# Patient Record
Sex: Female | Born: 1945 | Race: White | Hispanic: No | Smoking: Never smoker
Health system: Southern US, Community
[De-identification: ages and names within clinical notes are randomized; demographics above are authoritative.]

## PROBLEM LIST (undated history)

## (undated) DIAGNOSIS — F329 Major depressive disorder, single episode, unspecified: Secondary | ICD-10-CM

## (undated) DIAGNOSIS — I10 Essential (primary) hypertension: Secondary | ICD-10-CM

## (undated) DIAGNOSIS — E7800 Pure hypercholesterolemia, unspecified: Secondary | ICD-10-CM

## (undated) DIAGNOSIS — G2581 Restless legs syndrome: Secondary | ICD-10-CM

## (undated) DIAGNOSIS — F32A Depression, unspecified: Secondary | ICD-10-CM

## (undated) HISTORY — PX: BUNIONECTOMY: SHX129

## (undated) HISTORY — PX: CHOLECYSTECTOMY: SHX55

---

## 2012-04-22 DIAGNOSIS — Z85828 Personal history of other malignant neoplasm of skin: Secondary | ICD-10-CM | POA: Insufficient documentation

## 2012-05-05 DIAGNOSIS — G629 Polyneuropathy, unspecified: Secondary | ICD-10-CM | POA: Insufficient documentation

## 2012-11-04 DIAGNOSIS — L570 Actinic keratosis: Secondary | ICD-10-CM | POA: Insufficient documentation

## 2014-04-11 DIAGNOSIS — S32009A Unspecified fracture of unspecified lumbar vertebra, initial encounter for closed fracture: Secondary | ICD-10-CM | POA: Insufficient documentation

## 2016-03-27 ENCOUNTER — Encounter: Payer: Self-pay | Admitting: Physical Therapy

## 2016-03-27 ENCOUNTER — Ambulatory Visit: Payer: Medicare Other | Attending: Family Medicine | Admitting: Physical Therapy

## 2016-03-27 DIAGNOSIS — R269 Unspecified abnormalities of gait and mobility: Secondary | ICD-10-CM | POA: Insufficient documentation

## 2016-03-27 DIAGNOSIS — M6281 Muscle weakness (generalized): Secondary | ICD-10-CM | POA: Diagnosis not present

## 2016-03-28 NOTE — Therapy (Deleted)
[facility] [address] [place], [place], [address] Phone: [phone]   Fax:  [phone]  Physical Therapy Evaluation  Patient Details  Name: Barbara Ballard MRN: [id number] Date of Birth: Jun 16, 1946 Referring Provider: Doristine MangoElizabeth White  Encounter Date: 03/27/2016    History reviewed. No pertinent past medical history.  History reviewed. No pertinent past surgical history.  There were no vitals filed for this visit.               Patient will benefit from skilled therapeutic intervention in order to improve the following deficits and impairments:  Abnormal gait, Decreased strength, Improper body mechanics, Postural dysfunction, Difficulty walking, Decreased activity tolerance, Decreased mobility, Decreased range of motion, Decreased balance, Decreased safety awareness, Decreased coordination, Decreased endurance  Visit Diagnosis: Muscle weakness (generalized)  Gait difficulty     Problem List There are no active problems to display for this patient.  Cammie McgeeMichael C Lilliahna Schubring, PT, DPT # [id number]   04/01/2016, 12:48 PM   [facility] [address] [place], [place], [address] Phone: [phone]   Fax:  [phone]  Name: Barbara Ballard MRN: [id number] Date of Birth: Jun 16, 1946

## 2016-03-29 ENCOUNTER — Ambulatory Visit: Payer: Medicare Other | Admitting: Physical Therapy

## 2016-03-29 DIAGNOSIS — M6281 Muscle weakness (generalized): Secondary | ICD-10-CM | POA: Diagnosis not present

## 2016-03-29 DIAGNOSIS — R269 Unspecified abnormalities of gait and mobility: Secondary | ICD-10-CM

## 2016-03-30 NOTE — Therapy (Addendum)
[facility] [address] [place], [place], [address] Phone: [phone]   Fax:  [phone]  Physical Therapy Treatment  Patient Details  Name: Barbara Ballard MRN: [id number] Date of Birth: Feb 05, 1946 Referring Provider: Doristine Mango  Encounter Date: 03/29/2016    No past medical history on file.  No past surgical history on file.  There were no vitals filed for this visit.          [facility] PT Assessment - 04/01/16 0001    Assessment   Medical Diagnosis Falls frequently/ B LE muscle weakness   Referring Provider Doristine Mango   Onset Date/Surgical Date 11/27/15   Balance Screen   Has the patient fallen in the past 6 months Yes   How many times? 5+       Pt. reports no falls and states she is doing HEP on floor.  Pt. states she is still not interested in using [facility] with daily walking.       OBJECTIVE:  There.ex.:  Scifit L6 B UE/LE (no charge/ warm-up)- B thigh soreness.  Standing YTB posture ex.: scap. Retraction/ diagonal/ horizontal abduction 20x each (see handouts)- reviewed HEP.  Neuro: Obstacle course in hallway (cone taps/ step ups/ downs/ Airex).  Star ex./ Standing tandem stance and gait progression.  CGA for safety and verbal cuing.      Pt response for medical necessity:  Benefits from posture/ LE strengthening ex. To improve standing/walking balance.  CGA with higher level walking/ balance.  Generalized deconditioning.           PT Long Term Goals - 03/28/16 1243    PT LONG TERM GOAL #1   Title Pt. I with HEP to increase B LE muscle strength 1/2 muscle grade to improve standing/balance.     Baseline B LE flexibility WNL and strength grossly 4/5 MMT, except R hip flexion 4-/5, R hip IR 4+5, L knee flexion/ext 4+/5 MMT.   Time 4   Period Weeks   Status New   PT LONG TERM GOAL #2   Title Pt. will increase Berg balance test >52/56 to promote safety/ prevent falls.     Baseline Berg 48/56 on 5/2   Time 4   Status New   PT LONG TERM GOAL #3   Title Pt. able to proper lift 10# box from floor to waist and carry in clinic with no LOB to prevent falls.    Baseline several falls lifting heavier objects at home.    Time 4   Period Weeks   Status New   PT LONG TERM GOAL #4   Title Pt. will return to [employer] ex. program on a consistent schedule to improve LE muscle strength/ decrease fall risk.     Time 4   Period Weeks   Status New               Plan - 04/01/16 1306    Clinical Impression Statement PT incorporated standing postural ex. with resistance to improve center of balance/ shoulder/ head position.  Pt. benefits from CGA with all balance/ walking tasks in clinic and outside.  Pt. states she has stiffness in B LE and sorenss in thighs with use of Scifit.  No c/o pain.  Frequent cuing to increase BOS/ stridelength.     Rehab Potential Good   PT Frequency 2x / week   PT Duration 4 weeks   PT Treatment/Interventions ADLs/Self Care Home Management;Patient/family education;Neuromuscular re-education;Balance training;Therapeutic exercise;Therapeutic activities;Functional mobility training;Stair  training;Gait training;DME Instruction;Manual techniques   PT Next Visit Plan Progress HEP/ discuss return to [employer]   PT Home Exercise Plan See handouts.    Consulted and Agree with Plan of Care Patient      Patient will benefit from skilled therapeutic intervention in order to improve the following deficits and impairments:  Abnormal gait, Decreased strength, Improper body mechanics, Postural dysfunction, Difficulty walking, Decreased activity tolerance, Decreased mobility, Decreased range of motion, Decreased balance, Decreased safety awareness, Decreased coordination, Decreased endurance  Visit Diagnosis: Muscle weakness (generalized)  Gait difficulty     Problem List There are no active problems to display for this patient.  Cammie McgeeMichael C Sita Mangen, PT, DPT # [phone]    04/01/2016, 1:10 PM  [place] [facility] [address] [place], [place], [address] Phone: [phone]   Fax:  [phone]  Name: Barbara MangesKathleena Ballard MRN: [id number] Date of Birth: [birth year]

## 2016-04-01 NOTE — Addendum Note (Signed)
Addended by: Dorene GrebeSHERK, Armanii Urbanik C on: 04/01/2016 12:55 PM   Modules accepted: Orders

## 2016-04-01 NOTE — Therapy (Addendum)
[facility] [address] [place], [place], [address] Phone: [phone]   Fax:  [phone]  Physical Therapy Evaluation  Patient Details  Name: Barbara Ballard MRN: [id number] Date of Birth: 10/22/1946 Referring Provider: Doristine MangoElizabeth White  Encounter Date: 03/27/2016    History reviewed. No pertinent past medical history.  History reviewed. No pertinent past surgical history.  There were no vitals filed for this visit.           [facility] Assessment - 04/01/16 0001    Assessment   Medical Diagnosis Falls frequently/ B LE muscle weakness   Referring Provider Doristine MangoElizabeth White   Onset Date/Surgical Date 11/27/15   Balance Screen   Has the patient fallen in the past 6 months Yes   How many times? 5+      Pt. reports numerous falls over past several months.  Pt. reports chronic h/o polyneuropathy of B feet and states her last falls was while playing soccer with grandson.  Pt. reports difficulty keeping balance when picking up heavier objects (losses balance forward).  Pts. husband diagnosed with Myastina Gravis and she does all the household chores.  Pt. was participating with [employer] ex. program prior to last fall.       There.ex.:  See HEP (handouts attached).      Pt. is a pleasant 70 y/o female with h/o frequent falls/ gait difficulty over past several months.  Pt. reports no pain but presents with diminished sensation in B feet.  Pt. has forward head posture/ thoracic kyphosis in seated/ standing posture.  Berg balance test: 48/56 and pt. would benefit from use of SPC, but is not interested in using at this time.  B LE flexibility WNL and strength grossly 4/5 MMT, except R hip flexion 4-/5, R hip IR 4+5, L knee flexion/ext 4+/5 MMT.  Pt. ambulates with slow, antalgic gait pattern limited B arm swing/ step length.  Pt. will benefit from skilled PT services to increase B LE muscle strength to improve standing balance/ gait  to prevent falls.         PT Long Term Goals - 03/28/16 1243    PT LONG TERM GOAL #1   Title Pt. I with HEP to increase B LE muscle strength 1/2 muscle grade to improve standing/balance.     Baseline B LE flexibility WNL and strength grossly 4/5 MMT, except R hip flexion 4-/5, R hip IR 4+5, L knee flexion/ext 4+/5 MMT.   Time 4   Period Weeks   Status New   PT LONG TERM GOAL #2   Title Pt. will increase Berg balance test >52/56 to promote safety/ prevent falls.     Baseline Berg 48/56 on 5/2   Time 4   Status New   PT LONG TERM GOAL #3   Title Pt. able to proper lift 10# box from floor to waist and carry in clinic with no LOB to prevent falls.    Baseline several falls lifting heavier objects at home.    Time 4   Period Weeks   Status New   PT LONG TERM GOAL #4   Title Pt. will return to [employer] ex. program on a consistent schedule to improve LE muscle strength/ decrease fall risk.     Time 4   Period Weeks   Status New             Patient will benefit from skilled therapeutic intervention in order to improve the following deficits  and impairments:  Abnormal gait, Decreased strength, Improper body mechanics, Postural dysfunction, Difficulty walking, Decreased activity tolerance, Decreased mobility, Decreased range of motion, Decreased balance, Decreased safety awareness, Decreased coordination, Decreased endurance  Visit Diagnosis: Muscle weakness (generalized)  Gait difficulty     Problem List There are no active problems to display for this patient.  Cammie Mcgee, PT, DPT # [phone]   04/01/2016, 12:48 PM  [facility] [address] [place], [place], [address] Phone: [phone]   Fax:  [phone]  Name: Barbara Ballard MRN: [id number] Date of Birth: Jan 03, 1946

## 2016-04-03 ENCOUNTER — Encounter: Payer: Medicare Other | Admitting: Physical Therapy

## 2016-04-05 ENCOUNTER — Encounter: Payer: Medicare Other | Admitting: Physical Therapy

## 2016-04-10 ENCOUNTER — Ambulatory Visit: Payer: Medicare Other | Admitting: Physical Therapy

## 2016-04-10 DIAGNOSIS — M6281 Muscle weakness (generalized): Secondary | ICD-10-CM | POA: Diagnosis not present

## 2016-04-10 DIAGNOSIS — R269 Unspecified abnormalities of gait and mobility: Secondary | ICD-10-CM

## 2016-04-11 NOTE — Therapy (Signed)
[facility] [facility] [address] [place], [place], [address] Phone: [phone]   Fax:  [phone]  Physical Therapy Treatment  Patient Details  Name: Rosio Ballard MRN: [id number] Date of Birth: 05-13-1946 Referring Provider: Doristine Mango  Encounter Date: 04/10/2016    No past medical history on file.  No past surgical history on file.  There were no vitals filed for this visit.    No falls.  Pt. had a good time in [facility].  Minimal discomfort in R knee with standing squats (discussed proper technique to avoid knees over toes).     OBJECTIVE: There.ex.: Scifit L6 B UE/LE (no charge/ warm-up)- no c/o pain but fatigue noted. Discussed/reviewed HEP.  Standing hip flexion/ abd./ knee flexion/ heel raises with posture correction in //-bars and mirror feedback 20x each.  Step ups/downs 10x2 with cuing to increase head position. Neuro: Standing tandem stance and gait progression. NBOS/ SLS.  Walking in hallway with alt. UE/LE touches (SBA/CGA for safety).    Pt response for medical necessity: Benefits from posture/ LE strengthening ex. To improve standing/walking balance. Pt. Easily fatigued with standing ex. With few rest breaks.  No LOB during tx. Today but extra time with alt. UE/LE touches.         PT Long Term Goals - 03/28/16 1243    PT LONG TERM GOAL #1   Title Pt. I with HEP to increase B LE muscle strength 1/2 muscle grade to improve standing/balance.     Baseline B LE flexibility WNL and strength grossly 4/5 MMT, except R hip flexion 4-/5, R hip IR 4+5, L knee flexion/ext 4+/5 MMT.   Time 4   Period Weeks   Status New   PT LONG TERM GOAL #2   Title Pt. will increase Berg balance test >52/56 to promote safety/ prevent falls.     Baseline Berg 48/56 on 5/2   Time 4   Status New   PT LONG TERM GOAL #3   Title Pt. able to proper lift 10# box from floor to waist and carry in clinic with no LOB to prevent  falls.    Baseline several falls lifting heavier objects at home.    Time 4   Period Weeks   Status New   PT LONG TERM GOAL #4   Title Pt. will return to [employer] ex. program on a consistent schedule to improve LE muscle strength/ decrease fall risk.     Time 4   Period Weeks   Status New            Plan - 04/11/16 1803    Clinical Impression Statement PT reviewed proper technique with standing squats to avoid knee discomfort.  Pt. benefits from partial squats, maintaining midline knee positioning to avoid knee pain.  No LOB during standing balance/ gait activities in gym.  Verbal cuing to correct posture and occasional rest breaks.  No use of gait belt today with SBA and verbal curing provided.    Rehab Potential Good   PT Frequency 2x / week   PT Duration 4 weeks   PT Treatment/Interventions ADLs/Self Care Home Management;Patient/family education;Neuromuscular re-education;Balance training;Therapeutic exercise;Therapeutic activities;Functional mobility training;Stair training;Gait training;DME Instruction;Manual techniques   PT Next Visit Plan Progress HEP/ discuss return to [employer]   PT Home Exercise Plan See handouts.    Consulted and Agree with Plan of Care Patient      Patient will benefit from skilled therapeutic intervention in order to improve  the following deficits and impairments:  Abnormal gait, Decreased strength, Improper body mechanics, Postural dysfunction, Difficulty walking, Decreased activity tolerance, Decreased mobility, Decreased range of motion, Decreased balance, Decreased safety awareness, Decreased coordination, Decreased endurance  Visit Diagnosis: Muscle weakness (generalized)  Gait difficulty     Problem List There are no active problems to display for this patient.  Cammie McgeeMichael C Sherk, PT, DPT # [phone]   04/11/2016, 6:07 PM  [place] [facility] [address] [place], [place], [address] Phone:  [phone]   Fax:  [phone]  Name: Barbara Ballard MRN: [id number] Date of Birth: July 11, 1946

## 2016-04-12 ENCOUNTER — Encounter: Payer: Medicare Other | Admitting: Physical Therapy

## 2016-04-12 ENCOUNTER — Ambulatory Visit: Payer: Medicare Other | Admitting: Physical Therapy

## 2016-04-12 DIAGNOSIS — M6281 Muscle weakness (generalized): Secondary | ICD-10-CM | POA: Diagnosis not present

## 2016-04-12 DIAGNOSIS — R269 Unspecified abnormalities of gait and mobility: Secondary | ICD-10-CM

## 2016-04-12 NOTE — Therapy (Signed)
[place] [facility] [address] [place], [place], [address] Phone: [phone]   Fax:  [phone]  Physical Therapy Treatment  Patient Details  Name: Barbara Ballard MRN: [id number] Date of Birth: [birth year] Referring Provider: Doristine MangoElizabeth White  Encounter Date: 04/12/2016      PT End of Session - 04/13/16 1516    Visit Number 4   Number of Visits 8   Date for PT Re-Evaluation 04/24/16   Authorization - Visit Number 4   Authorization - Number of Visits 10   PT Start Time 1621   PT Stop Time 1706   PT Time Calculation (min) 45 min   Equipment Utilized During Treatment Gait belt   Activity Tolerance Patient tolerated treatment well   Behavior During Therapy [facility] for tasks assessed/performed      No past medical history on file.  No past surgical history on file.  There were no vitals filed for this visit.      Subjective Assessment - 04/13/16 1515    Subjective No falls and pt. states she is tired today from being on the go since the morning.     Limitations Standing;Walking;House hold activities   Patient Stated Goals Improve balance/ gait.     Currently in Pain? No/denies      Discussed difficulty walking dog (a 706  Month old puppy).     OBJECTIVE: There.ex.: Scifit L6.5 B UE/LE (no charge/ warm-up)- no c/o pain but fatigue noted.   Standing hip flexion/ abd./ knee flexion/ heel raises with posture correction in //-bars and mirror feedback 20x each. Step ups/downs 10x2 with cuing to increase head position. Neuro: Standing tandem stance and gait progression. NBOS/ SLS. Walking in hallway with alt. UE/LE touches (SBA/CGA for safety).          PT Long Term Goals - 03/28/16 1243    PT LONG TERM GOAL #1   Title Pt. I with HEP to increase B LE muscle strength 1/2 muscle grade to improve standing/balance.     Baseline B LE flexibility WNL and strength grossly 4/5 MMT, except R hip flexion 4-/5, R hip IR 4+5, L knee  flexion/ext 4+/5 MMT.   Time 4   Period Weeks   Status New   PT LONG TERM GOAL #2   Title Pt. will increase Berg balance test >52/56 to promote safety/ prevent falls.     Baseline Berg 48/56 on 5/2   Time 4   Status New   PT LONG TERM GOAL #3   Title Pt. able to proper lift 10# box from floor to waist and carry in clinic with no LOB to prevent falls.    Baseline several falls lifting heavier objects at home.    Time 4   Period Weeks   Status New   PT LONG TERM GOAL #4   Title Pt. will return to [employer] ex. program on a consistent schedule to improve LE muscle strength/ decrease fall risk.     Time 4   Period Weeks   Status New               Plan - 04/13/16 1516    Clinical Impression Statement 2 LOB with higher level [employer] ex./ [employer].   Moderate cuing for posture correction.    Rehab Potential Good   PT Frequency 2x / week   PT Duration 4 weeks   PT Treatment/Interventions ADLs/Self Care Home Management;Patient/family education;Neuromuscular re-education;Balance training;Therapeutic exercise;Therapeutic activities;Functional mobility training;Stair training;Gait training;DME Instruction;Manual  techniques   PT Next Visit Plan Progress HEP/ discuss return to [employer]   PT Home Exercise Plan See handouts.    Consulted and Agree with Plan of Care Patient      Patient will benefit from skilled therapeutic intervention in order to improve the following deficits and impairments:  Abnormal gait, Decreased strength, Improper body mechanics, Postural dysfunction, Difficulty walking, Decreased activity tolerance, Decreased mobility, Decreased range of motion, Decreased balance, Decreased safety awareness, Decreased coordination, Decreased endurance  Visit Diagnosis: Muscle weakness (generalized)  Gait difficulty     Problem List There are no active problems to display for this patient.  Cammie Mcgee, PT, DPT # [phone]   04/13/2016, 3:18 PM  [facility] [address] [place], [place], [address] Phone: [phone]   Fax:  [phone]  Name: Barbara Ballard MRN: [id number] Date of Birth: [birth year]

## 2016-04-17 ENCOUNTER — Ambulatory Visit: Payer: Medicare Other | Admitting: Physical Therapy

## 2016-04-17 DIAGNOSIS — M6281 Muscle weakness (generalized): Secondary | ICD-10-CM

## 2016-04-17 DIAGNOSIS — R269 Unspecified abnormalities of gait and mobility: Secondary | ICD-10-CM

## 2016-04-17 NOTE — Therapy (Signed)
[place] [facility] [address] [place], [place], [address] Phone: [phone]   Fax:  [phone]  Physical Therapy Treatment  Patient Details  Name: Barbara Ballard MRN: [id number] Date of Birth: 1946-01-10 Referring Provider: Eulogio Bear  Encounter Date: 04/17/2016      PT End of Session - 04/17/16 0827    Visit Number 5   Number of Visits 8   Date for PT Re-Evaluation 04/24/16   Authorization - Visit Number 5   Authorization - Number of Visits 10   PT Start Time 0813   PT Stop Time 0905   PT Time Calculation (min) 52 min   Equipment Utilized During Treatment Gait belt   Activity Tolerance Patient tolerated treatment well   Behavior During Therapy [facility] for tasks assessed/performed      No past medical history on file.  No past surgical history on file.  There were no vitals filed for this visit.      Subjective Assessment - 04/17/16 0827    Subjective Pt. reports active weekend and no falls or new complaints reported.    Limitations Standing;Walking;House hold activities   Patient Stated Goals Improve balance/ gait.     Currently in Pain? No/denies        OBJECTIVE: There.ex.: Scifit L7 B UE/LE (no charge/ warm-up)- min. R knee discomfort but not painful. Neuro:  Berg balance test: 52/56.  5x sit to stand: 11.7 sec./ 10.3 sec.  TUG: 8.9 sec./ 9.8 sec. Floor to kneeling to standing instruction on blue mat.  Floor to waist box lifting 15# with B carrying around clinic (cuing to increase BOS with setting box down).  Obstacle course in hallway (cone taps/ step ups/ downs/ Airex). Discussed in depth [employer] ex. Program and progression to more of an independent focus.      Pt response for medical necessity: Benefits from posture/ LE strengthening ex. To improve standing/walking balance. Marked improvement in balance and safety with floor to waist lifting (benefits from correction of increase BOS/  upright posture).  Maintains object close to body with B carrying around clinic.         PT Long Term Goals - 04/17/16 1017    PT LONG TERM GOAL #1   Title Pt. I with HEP to increase B LE muscle strength 1/2 muscle grade to improve standing/balance.     Baseline B LE flexibility WNL and strength grossly 4+/5 MMT, except R hip flexion 4/5, R hip IR 4+5, L knee flexion/ext 4+/5, DF 5/5, IV/EV MMT.   Time 4   Period Weeks   Status Achieved   PT LONG TERM GOAL #2   Title Pt. will increase Berg balance test >52/56 to promote safety/ prevent falls.     Baseline 52/56 on 5/23   Time 4   Period Weeks   Status Achieved   PT LONG TERM GOAL #3   Title Pt. able to proper lift 10# box from floor to waist and carry in clinic with no LOB to prevent falls.    Time 4   Period Weeks   Status Achieved   PT LONG TERM GOAL #4   Title Pt. will return to [employer] ex. program on a consistent schedule to improve LE muscle strength/ decrease fall risk.     Time 4   Period Weeks   Status Partially Met       Patient will benefit from skilled therapeutic intervention in order to improve the following deficits and  impairments:  Abnormal gait, Decreased strength, Improper body mechanics, Postural dysfunction, Difficulty walking, Decreased activity tolerance, Decreased mobility, Decreased range of motion, Decreased balance, Decreased safety awareness, Decreased coordination, Decreased endurance  Visit Diagnosis: Muscle weakness (generalized)  Gait difficulty     Problem List There are no active problems to display for this patient.  Pura Spice, PT, DPT # [phone]   04/17/2016, 12:56 PM  [place] [facility] [address] [place], [place], [address] Phone: [phone]   Fax:  [phone]  Name: Barbara Ballard MRN: [id number] Date of Birth: Feb 27, 1946

## 2016-04-19 ENCOUNTER — Encounter: Payer: Medicare Other | Admitting: Physical Therapy

## 2016-12-06 ENCOUNTER — Ambulatory Visit (INDEPENDENT_AMBULATORY_CARE_PROVIDER_SITE_OTHER): Payer: Medicare Other

## 2016-12-06 ENCOUNTER — Ambulatory Visit
Admission: EM | Admit: 2016-12-06 | Discharge: 2016-12-06 | Disposition: A | Discharge status: Home / Self Care | Payer: Medicare Other | Attending: Family Medicine | Admitting: Family Medicine

## 2016-12-06 DIAGNOSIS — Z23 Encounter for immunization: Secondary | ICD-10-CM

## 2016-12-06 DIAGNOSIS — S62639B Displaced fracture of distal phalanx of unspecified finger, initial encounter for open fracture: Secondary | ICD-10-CM

## 2016-12-06 HISTORY — DX: Pure hypercholesterolemia, unspecified: E78.00

## 2016-12-06 HISTORY — DX: Essential (primary) hypertension: I10

## 2016-12-06 HISTORY — DX: Depression, unspecified: F32.A

## 2016-12-06 HISTORY — DX: Restless legs syndrome: G25.81

## 2016-12-06 HISTORY — DX: Major depressive disorder, single episode, unspecified: F32.9

## 2016-12-06 MED ORDER — TETANUS-DIPHTH-ACELL PERTUSSIS 5-2.5-18.5 LF-MCG/0.5 IM SUSP
0.5000 mL | Freq: Once | INTRAMUSCULAR | Status: AC
  Administered 2016-12-06: 0.5 mL via INTRAMUSCULAR

## 2016-12-06 NOTE — ED Triage Notes (Signed)
Pt reports slamming the tip of her left hand middle finger in door hinge. Actively bleeding in triage and has been bleeding all day. Tip of finger avulsion

## 2016-12-06 NOTE — ED Provider Notes (Signed)
[facility]    CSN: [id number] Arrival date & time: 12/06/16  1817     History   Chief Complaint Chief Complaint  Patient presents with  . Finger Injury    HPI Barbara Ballard is a 71 y.o. female.   Patient caught middle finger of her left hand in a door while taking care of her child earlier today. The finger has been bleeding since the injury occurred about 9:00 this morning is almost 12 hours later. Barbara Ballard comes in tonight because of the persistent bleeding of the finger. Barbara Ballard has described a lot of discomfort in the tip of the finger as well. This history depression of a cholesterol hypertension versus legs syndrome in the past apical history. Barbara Ballard's had bunionectomy and a cholecystectomy as far surgery is concerned. Barbara Ballard does not smoke and never never smoked in fact and Barbara Ballard has no known drug allergies. NO pertinent family medical history relevant to today's visit present either   The history is provided by the spouse and the patient. No language interpreter was used.    Past Medical History:  Diagnosis Date  . Depression   . High cholesterol   . Hypertension   . Restless leg     There are no active problems to display for this patient.   Past Surgical History:  Procedure Laterality Date  . BUNIONECTOMY    . CHOLECYSTECTOMY      OB History    No data available       Home Medications    Prior to Admission medications   Medication Sig Start Date End Date Taking? Authorizing Provider  aspirin 81 MG chewable tablet Chew by mouth daily.   Yes Historical Provider, MD  buPROPion (WELLBUTRIN SR) 150 MG 12 hr tablet Take 150 mg by mouth 2 (two) times daily.   Yes Historical Provider, MD  FLUoxetine (PROZAC) 40 MG capsule Take 40 mg by mouth daily.   Yes Historical Provider, MD  lisinopril (PRINIVIL,ZESTRIL) 40 MG tablet Take 40 mg by mouth daily.   Yes Historical Provider, MD  rOPINIRole (REQUIP) 1 MG tablet Take 1 mg by mouth 3 (three) times daily.   Yes  Historical Provider, MD  simvastatin (ZOCOR) 40 MG tablet Take 40 mg by mouth daily.   Yes Historical Provider, MD    Family History History reviewed. No pertinent family history.  Social History Social History  Substance Use Topics  . Smoking status: Never Smoker  . Smokeless tobacco: Never Used  . Alcohol use No     Allergies   Patient has no known allergies.   Review of Systems Review of Systems  Musculoskeletal: Positive for joint swelling and myalgias.  All other systems reviewed and are negative.    Physical Exam Triage Vital Signs ED Triage Vitals  Enc Vitals Group     BP 12/06/16 1857 (!) 152/78     Pulse Rate 12/06/16 1857 87     Resp 12/06/16 1857 16     Temp 12/06/16 1857 98.3 F (36.8 C)     Temp Source 12/06/16 1857 Oral     SpO2 12/06/16 1857 100 %     Weight 12/06/16 1856 166 lb (75.3 kg)     Height 12/06/16 1856 5' 6.5" (1.689 m)     Head Circumference --      Peak Flow --      Pain Score 12/06/16 1902 3     Pain Loc --      Pain Edu? --  Excl. in GC? --    No data found.   Updated Vital Signs BP (!) 152/78 (BP Location: Right Arm)   Pulse 87   Temp 98.3 F (36.8 C) (Oral)   Resp 16   Ht 5' 6.5" (1.689 m)   Wt 166 lb (75.3 kg)   SpO2 100%   BMI 26.39 kg/m   Visual Acuity Right Eye Distance:   Left Eye Distance:   Bilateral Distance:    Right Eye Near:   Left Eye Near:    Bilateral Near:     Physical Exam  Constitutional: Barbara Ballard is oriented to person, place, and time. Barbara Ballard appears well-developed and well-nourished.  HENT:  Head: Normocephalic.  Eyes: Pupils are equal, round, and reactive to light.  Neck: Normal range of motion.  Pulmonary/Chest: Effort normal.  Musculoskeletal: Normal range of motion. Barbara Ballard exhibits tenderness and deformity.       Hands: Patient has a laceration over the entire distal left middle finger. The whole dorsal surface basically been lacerated with some involvement of the palmar surface as well.    Neurological: Barbara Ballard is oriented to person, place, and time.  Skin: Skin is warm.  Psychiatric: Barbara Ballard has a normal mood and affect.  Vitals reviewed.    UC Treatments / Results  Labs (all labs ordered are listed, but only abnormal results are displayed) Labs Reviewed - No data to display  EKG  EKG Interpretation None       Radiology Dg Finger Middle Left  Result Date: 12/06/2016 CLINICAL DATA:  Avulsion wound of distal third finger left hand EXAM: LEFT MIDDLE FINGER 2+V COMPARISON:  None. FINDINGS: There is fracture of the distal tuft of the third distal phalanx. There is no dislocation. IMPRESSION: Fracture of distal tuft of the third distal phalanx. Electronically Signed   By: Sherian ReinWei-Chen  Lin M.D.   On: 12/06/2016 19:26    Procedures Procedures (including critical care time)  Medications Ordered in UC Medications  Tdap (BOOSTRIX) injection 0.5 mL (0.5 mLs Intramuscular Given 12/06/16 1909)     Initial Impression / Assessment and Plan / UC Course  I have reviewed the triage vital signs and the nursing notes.  Pertinent labs & imaging results that were available during my care of the patient were reviewed by me and considered in my medical decision making (see chart for details).  Clinical Course     X-ray of the middle finger shows patent tuft fracture with some displacement. Tetanus musician given today the patient informed that Barbara Ballard will need to go to the emergency room for possible irrigation of this finger with possible IV antibiotics versus oral antibiotics and further evaluation. At this time of evening will be difficult to try to do any type of repair this at urgent care center. Patient husband indicate they [facility] to [facility] or [facility] and then making the decision. Either way disc will be given an report of x-ray fracture the finger was cleaned and irrigated while here by soaking  Final Clinical Impressions(s) / UC Diagnoses   Final diagnoses:  Open  fracture of tuft of distal phalanx of finger    New Prescriptions New Prescriptions   No medications on file     Note: This dictation was prepared with Dragon dictation along with smaller phrase technology. Any transcriptional errors that result from this process are unintentional.   Hassan RowanEugene Kassaundra Hair, MD 12/06/16 2033

## 2016-12-24 DIAGNOSIS — S62633D Displaced fracture of distal phalanx of left middle finger, subsequent encounter for fracture with routine healing: Secondary | ICD-10-CM | POA: Insufficient documentation

## 2018-03-19 DIAGNOSIS — K029 Dental caries, unspecified: Secondary | ICD-10-CM | POA: Insufficient documentation

## 2018-08-12 DIAGNOSIS — F331 Major depressive disorder, recurrent, moderate: Secondary | ICD-10-CM | POA: Insufficient documentation

## 2019-08-04 DIAGNOSIS — M8588 Other specified disorders of bone density and structure, other site: Secondary | ICD-10-CM | POA: Insufficient documentation

## 2019-12-23 DIAGNOSIS — F4321 Adjustment disorder with depressed mood: Secondary | ICD-10-CM | POA: Insufficient documentation

## 2019-12-23 DIAGNOSIS — E876 Hypokalemia: Secondary | ICD-10-CM | POA: Insufficient documentation

## 2020-07-15 DIAGNOSIS — I951 Orthostatic hypotension: Secondary | ICD-10-CM | POA: Insufficient documentation

## 2020-09-09 DIAGNOSIS — H903 Sensorineural hearing loss, bilateral: Secondary | ICD-10-CM | POA: Insufficient documentation

## 2021-02-23 DIAGNOSIS — H02831 Dermatochalasis of right upper eyelid: Secondary | ICD-10-CM | POA: Insufficient documentation

## 2021-02-23 DIAGNOSIS — H532 Diplopia: Secondary | ICD-10-CM | POA: Insufficient documentation

## 2021-02-23 DIAGNOSIS — H5032 Intermittent alternating esotropia: Secondary | ICD-10-CM | POA: Insufficient documentation

## 2021-02-23 DIAGNOSIS — H2513 Age-related nuclear cataract, bilateral: Secondary | ICD-10-CM | POA: Insufficient documentation

## 2021-02-23 DIAGNOSIS — H518 Other specified disorders of binocular movement: Secondary | ICD-10-CM | POA: Insufficient documentation

## 2021-07-17 DIAGNOSIS — Z01818 Encounter for other preprocedural examination: Secondary | ICD-10-CM | POA: Insufficient documentation

## 2022-03-21 ENCOUNTER — Ambulatory Visit (INDEPENDENT_AMBULATORY_CARE_PROVIDER_SITE_OTHER): Payer: Medicare HMO

## 2022-03-21 ENCOUNTER — Ambulatory Visit
Admission: EM | Admit: 2022-03-21 | Discharge: 2022-03-21 | Disposition: A | Discharge status: Home / Self Care | Payer: Medicare HMO | Attending: Emergency Medicine | Admitting: Emergency Medicine

## 2022-03-21 DIAGNOSIS — S61217A Laceration without foreign body of left little finger without damage to nail, initial encounter: Secondary | ICD-10-CM

## 2022-03-21 DIAGNOSIS — G2581 Restless legs syndrome: Secondary | ICD-10-CM | POA: Insufficient documentation

## 2022-03-21 DIAGNOSIS — M79645 Pain in left finger(s): Secondary | ICD-10-CM | POA: Diagnosis not present

## 2022-03-21 DIAGNOSIS — E785 Hyperlipidemia, unspecified: Secondary | ICD-10-CM | POA: Insufficient documentation

## 2022-03-21 DIAGNOSIS — F32A Depression, unspecified: Secondary | ICD-10-CM | POA: Insufficient documentation

## 2022-03-21 DIAGNOSIS — I10 Essential (primary) hypertension: Secondary | ICD-10-CM | POA: Insufficient documentation

## 2022-03-21 MED ORDER — BACITRACIN ZINC 500 UNIT/GM EX OINT
1.0000 "application " | TOPICAL_OINTMENT | Freq: Once | CUTANEOUS | Status: AC
  Administered 2022-03-21: 1 via TOPICAL

## 2022-03-21 MED ORDER — CEPHALEXIN 500 MG PO CAPS
1000.0000 mg | ORAL_CAPSULE | Freq: Two times a day (BID) | ORAL | 0 refills | Status: AC
Start: 2022-03-21 — End: 2022-03-26

## 2022-03-21 NOTE — Discharge Instructions (Addendum)
Keep this clean and dry for the first 48- 72 hours.  Then you can take the bandage off.  Keep this clean with soap and water.  Give it some dry time so that it can heal.  We will see you in 10 days for suture removal.  Return here sooner for any signs of infection.  You may take at 1000 mg of Tylenol 3-4 times a day as needed for pain. ?

## 2022-03-21 NOTE — ED Provider Notes (Signed)
HPI ? ?SUBJECTIVE: ? ?Barbara Ballard is a right-handed 76 y.o. female who presents with left little finger laceration on her distal fingertips sustained at 1900 last night with electric trimming shears.  She reports mild pain.  No foreign body sensation, weakness, swelling, erythema streaking up her finger.  She cleaned it out with soap and water without improvement in her symptoms.  Symptoms are worse with palpation.  She reports continued bleeding.  Her tetanus is up-to-date.  She has a past medical history of hypertension, hypercholesterolemia.  She is not on any anticoagulants or antiplatelets.  PCP: [facility] ? ? ?Past Medical History:  ?Diagnosis Date  ? Depression   ? High cholesterol   ? Hypertension   ? Restless leg   ? ? ?Past Surgical History:  ?Procedure Laterality Date  ? BUNIONECTOMY    ? CHOLECYSTECTOMY    ? ? ?History reviewed. No pertinent family history. ? ?Social History  ? ?Tobacco Use  ? Smoking status: Never  ? Smokeless tobacco: Never  ?Substance Use Topics  ? Alcohol use: No  ? Drug use: No  ? ? ?No current facility-administered medications for this encounter. ? ?Current Outpatient Medications:  ?  buPROPion (WELLBUTRIN XL) 300 MG 24 hr tablet, Take by mouth., Disp: , Rfl:  ?  cephALEXin (KEFLEX) 500 MG capsule, Take 2 capsules (1,000 mg total) by mouth 2 (two) times daily for 5 days., Disp: 20 capsule, Rfl: 0 ?  chlorthalidone (HYGROTON) 25 MG tablet, Take 1 tablet by mouth daily., Disp: , Rfl:  ?  losartan (COZAAR) 25 MG tablet, Take 1 tablet by mouth daily., Disp: , Rfl:  ?  potassium chloride (KLOR-CON) 10 MEQ tablet, Take by mouth., Disp: , Rfl:  ?  Prednisol Ace-Moxiflox-Bromfen 1-0.5-0.075 % SUSP, Apply to eye., Disp: , Rfl:  ?  rOPINIRole (REQUIP) 1 MG tablet, Take 1 tablet by mouth 3 (three) times daily., Disp: , Rfl:  ?  sodium fluoride (FLUORISHIELD) 1.1 % GEL dental gel, Place onto teeth., Disp: , Rfl:  ?  timolol (TIMOPTIC) 0.5 % ophthalmic solution, Place 1 drop into  both eyes 2 (two) times daily., Disp: , Rfl:  ?  aspirin 81 MG chewable tablet, Chew by mouth daily., Disp: , Rfl:  ?  aspirin 81 MG EC tablet, Take by mouth., Disp: , Rfl:  ?  buPROPion (WELLBUTRIN SR) 150 MG 12 hr tablet, Take 150 mg by mouth 2 (two) times daily., Disp: , Rfl:  ?  buPROPion (WELLBUTRIN SR) 150 MG 12 hr tablet, Take by mouth., Disp: , Rfl:  ?  Cholecalciferol 50 MCG (2000 UT) TABS, Take by mouth., Disp: , Rfl:  ?  FLUoxetine (PROZAC) 40 MG capsule, Take 40 mg by mouth daily., Disp: , Rfl:  ?  FLUoxetine (PROZAC) 40 MG capsule, Take by mouth., Disp: , Rfl:  ?  lisinopril (PRINIVIL,ZESTRIL) 40 MG tablet, Take 40 mg by mouth daily., Disp: , Rfl:  ?  lisinopril (ZESTRIL) 40 MG tablet, Take by mouth., Disp: , Rfl:  ?  rOPINIRole (REQUIP) 1 MG tablet, Take 1 mg by mouth 3 (three) times daily., Disp: , Rfl:  ?  simvastatin (ZOCOR) 40 MG tablet, Take 40 mg by mouth daily., Disp: , Rfl:  ?  simvastatin (ZOCOR) 40 MG tablet, Take by mouth., Disp: , Rfl:  ?  UNKNOWN TO PATIENT, Take by mouth., Disp: , Rfl:  ? ?No Known Allergies ? ? ?ROS ? ?As noted in HPI.  ? ?Physical Exam ? ?BP 132/90 (  BP Location: Left Arm)   Pulse 82   Temp 98.3 ?F (36.8 ?C) (Oral)   Resp 18   SpO2 99%  ? ?Constitutional: Well developed, well nourished, no acute distress ?Eyes:  EOMI, conjunctiva normal bilaterally ?HENT: Normocephalic, atraumatic,mucus membranes moist ?Respiratory: Normal inspiratory effort ?Cardiovascular: Normal rate ?GI: nondistended ?skin: See musculoskeletal ?Musculoskeletal: 1 cm irregular laceration distal tip of left little finger.  Positive mild tenderness.  Two-point discrimination intact.  Cap refill less than 2 seconds.  Positive discoloration suggestive of bruising.  FDP/FDS intact, nontender.  No swelling.  ? ? ? ? ? ?Neurologic: Alert & oriented x 3, no focal neuro deficits ?Psychiatric: Speech and behavior appropriate ? ? ?ED Course ? ? ?Medications  ?bacitracin ointment 1 application. (1  application. Topical Given 03/21/22 1156)  ? ? ?Orders Placed This Encounter  ?Procedures  ? DG Finger Little Left  ?  Standing Status:   Standing  ?  Number of Occurrences:   1  ?  Order Specific Question:   Reason for Exam (SYMPTOM  OR DIAGNOSIS REQUIRED)  ?  Answer:   Trauma with electric trimming shears rule out fracture  ? Apply dressing  ?  Standing Status:   Standing  ?  Number of Occurrences:   1  ? ? ?No results found for this or any previous visit (from the past 24 hour(s)). ?DG Finger Little Left ? ?Result Date: 03/21/2022 ?CLINICAL DATA:  Trauma to tip of LEFT little finger with electric trimming shears EXAM: LEFT LITTLE FINGER 2+V COMPARISON:  None FINDINGS: Soft tissue irregularity at tip of distal phalanx. Osseous demineralization. Mild narrowing of IP joints. No acute fracture, dislocation, or bone destruction. No radiopaque foreign bodies. IMPRESSION: No acute osseous abnormalities. Electronically Signed   By: Lavonia Dana M.D.   On: 03/21/2022 10:57   ? ?ED Clinical Impression ? ?1. Laceration of left little finger without foreign body without damage to nail, initial encounter   ?  ? ?ED Assessment/Plan ? ?Checking  x-ray to rule out fracture, retained foreign body. ? ?Reviewed imaging independently.  No fracture, retained foreign body.  See radiology report for full details. ? ?We discussed doing Steri-Strips versus suturing, but it continues to bleed and she is not on any anticoagulants or antiplatelets.  We decided to suture this. ? ?Procedure note: Cleaned finger with alcohol.  Performed a digital block with 1 cc of 2% plain lidocaine with adequate anesthesia.  Then extensively scrubbed finger out with chlorhexidine and tap water.  Wound explored with adequate hemostasis: No debris seen.  Placed two 5-0 interrupted Prolene suture with fairly close approximation of wound edges.  Dressing placed.  Patient tolerated procedure well. ? ?Will send home with Keflex as this was sustained with an object  contaminated with organic material.  Tylenol as needed for pain.  Return here or follow-up with PCP in 10 days for suture removal, sooner for any signs of infection. ? ?Discussed imaging, MDM, treatment plan, and plan for follow-up with patient. patient agrees with plan.  ? ?Meds ordered this encounter  ?Medications  ? cephALEXin (KEFLEX) 500 MG capsule  ?  Sig: Take 2 capsules (1,000 mg total) by mouth 2 (two) times daily for 5 days.  ?  Dispense:  20 capsule  ?  Refill:  0  ? bacitracin ointment 1 application.  ? ? ? ? ?*This clinic note was created using [employer]. Therefore, there may be occasional mistakes despite careful proofreading. ? ?? ? ?  ?  Melynda Ripple, MD ?03/22/22 2000 ? ?

## 2022-03-21 NOTE — ED Triage Notes (Signed)
Pt present laceration on the left hand last night. Pt is up to date on tetanus shot.  ?

## 2022-11-27 ENCOUNTER — Ambulatory Visit: Payer: Medicare HMO | Attending: Family Medicine | Admitting: Physical Therapy

## 2022-11-27 ENCOUNTER — Encounter: Payer: Self-pay | Admitting: Physical Therapy

## 2022-11-27 DIAGNOSIS — R262 Difficulty in walking, not elsewhere classified: Secondary | ICD-10-CM | POA: Insufficient documentation

## 2022-11-27 DIAGNOSIS — R2689 Other abnormalities of gait and mobility: Secondary | ICD-10-CM

## 2022-11-27 DIAGNOSIS — Z9181 History of falling: Secondary | ICD-10-CM | POA: Diagnosis not present

## 2022-11-27 DIAGNOSIS — M6281 Muscle weakness (generalized): Secondary | ICD-10-CM | POA: Insufficient documentation

## 2022-11-27 NOTE — Therapy (Unsigned)
OUTPATIENT PHYSICAL THERAPY FALLS/BALANCE EVALUATION   Patient Name: Barbara Ballard MRN: [id number] DOB:[birth year], 77 y.o., female Today's Date: 11/27/2022   PT End of Session - 11/27/22 1241     Visit Number 1    Number of Visits 17    Date for PT Re-Evaluation 01/22/23    Authorization Type [employer] 2024    Progress Note Due on Visit 10    PT Start Time 1250    PT Stop Time 1335    PT Time Calculation (min) 45 min    Equipment Utilized During Treatment Gait belt    Activity Tolerance Patient tolerated treatment well    Behavior During Therapy WFL for tasks assessed/performed             Past Medical History:  Diagnosis Date   Depression    High cholesterol    Hypertension    Restless leg    Past Surgical History:  Procedure Laterality Date   BUNIONECTOMY     CHOLECYSTECTOMY     Patient Active Problem List   Diagnosis Date Noted   Depression 03/21/2022   Essential hypertension 03/21/2022   Hyperlipidemia 03/21/2022   Restless leg syndrome 03/21/2022   Preoperative evaluation to rule out surgical contraindication 07/17/2021   Dermatochalasis of upper and lower eyelids of both eyes 02/23/2021   Diplopia 02/23/2021   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   Nuclear sclerotic cataract of both eyes 02/23/2021   Bilateral sensorineural hearing loss 09/09/2020   Orthostatic hypotension 07/15/2020   Grief 12/23/2019   Hypokalemia 12/23/2019   Osteopenia of spine 08/04/2019   Moderate episode of recurrent major depressive disorder ([place]) 08/12/2018   Dental decay 03/19/2018   Closed displaced fracture of distal phalanx of left middle finger with routine healing 12/24/2016   Fracture of lumbar spine ([place]) 04/11/2014   AK (actinic keratosis) 11/04/2012   Neuropathy 05/05/2012   Personal history of other malignant neoplasm of skin 04/22/2012    PCP: [facility], [place]  REFERRING PROVIDER: Hilton Sinclair,  PA-C  REFERRING DIAGNOSIS:  R29.6 (ICD-10-CM) - Repeated falls    THERAPY DIAG: History of falling  Difficulty in walking, not elsewhere classified  Muscle weakness (generalized)  Imbalance  ONSET DATE: Hospital admission 10/16/2022  FOLLOW UP APPT WITH PROVIDER: No    RATIONALE FOR EVALUATION AND TREATMENT: Rehabilitation  SUBJECTIVE:                                                                                                                                                                                         Chief Complaint: Patient is a 77 year old female referred to  outpatient PT for frequent falls following hospital discharge (pt was admitted 10/16/22 due to pneumonia and had positive urinalysis for infection).   Pertinent History Patient is a 77 year old female referred to outpatient PT for frequent falls following hospital discharge (pt was admitted 10/16/22 due to pneumonia and had positive urinalysis for infection). Patient currently reports that her condition has improved, but she still has not gotten her voice back. Pt denies chest pain or SOB with getting around. Pt reports difficulty with balance and fear of falling. She reports unsteadiness on her feet that is worse after prolonged sitting. Pt reports LOB with bending down e.g. doing gardening and trying to get up. She reports no major change since hospitalization with this. Pt reports neuropathy affecting her feet. Patient reports    Pain: No Numbness/Tingling: Yes, numbness in feet and paresthesias in feet/toes  Focal Weakness: No Recent changes in overall health/medication: Yes, hospitalization in November  Prior history of physical therapy for balance:  Yes, history of PT for balance in 2916  Falls: Has patient fallen in last 6 months? Yes, Number of falls: 3-4 times, on injurious falls  Directional pattern for falls: Yes, forward  Imaging: No  Prior level of function: Independent Occupational demands:  N/A Hobbies: walking, gardening  Red flags (bowel/bladder changes, saddle paresthesia, personal history of cancer, h/o spinal tumors, h/o compression fx, h/o abdominal aneurysm, abdominal pain, chills/fever, night sweats, nausea, vomiting, unrelenting pain): Negative  Precautions: None  Weight Bearing Restrictions: No  Living Environment Lives with: lives alone, her son lives nearby; daughter lives in [place]  Lives in: House/apartment,  home is 2 levels. She has spiral staircase to get to 2nd floor with handrail on one side. Her bedroom/bathroom is downstairs.  2 steps to get into home at back entrance, handrail on both sides. Has following equipment at home: Single point cane, shower chair, Grab bars, and bars for toilet transfer    Patient Goals: Strengthen my balance, more endurance     OBJECTIVE:   Patient Surveys  FOTO: 33 ,predicted improvement to 70 ABC: 36.9%   Cognition Patient is oriented to person, place, and time.  Recent memory is intact.  Remote memory is intact.  Attention span and concentration are intact.  Expressive speech is intact.  Patient's fund of knowledge is within normal limits for educational level.     Gross Musculoskeletal Assessment Tremor: None Bulk: Normal Tone: Normal   GAIT: Distance walked: 150 Assistive device utilized: None Level of assistance: SBA Comments: ***   Posture: FHRS posture, increased thoracic kyphosis    LE MMT:  MMT (out of 5) Right 11/27/2022 Left 11/27/2022  Hip flexion 4- 4-  Hip extension    Hip abduction (seated) 4 4  Hip adduction (seated)  5 5  Hip internal rotation    Hip external rotation    Knee flexion 5 4+  Knee extension 4+ 5-  Ankle dorsiflexion 4+ 4+  Ankle plantarflexion    Ankle inversion    Ankle eversion    (* = pain; Blank rows = not tested)   Sensation Loss of light touch sensation in bilateral digits of feet    Reflexes Deferred   Cranial  Nerves Deferred   Coordination/Cerebellar Finger to Nose: WNL Heel to Shin: WNL Rapid alternating movements: WNL Finger Opposition: WNL Pronator Drift: Negative   FUNCTIONAL OUTCOME MEASURES   Results Comments  BERG 47/56 Fall risk, in need of intervention  DGI Next visit   TUG 13.63 seconds Fall risk, in  need of intervention    5TSTS 18 seconds Fall risk, in need of intervention    6 Minute Walk Test Next visit   (Blank rows = not tested)    [facility] PT Assessment - 11/27/22 0001       Standardized Balance Assessment   Standardized Balance Assessment Berg Balance Test      Berg Balance Test   Sit to Stand Able to stand without using hands and stabilize independently    Standing Unsupported Able to stand 2 minutes with supervision    Sitting with Back Unsupported but Feet Supported on Floor or Stool Able to sit safely and securely 2 minutes    Stand to Sit Sits safely with minimal use of hands    Transfers Able to transfer safely, minor use of hands    Standing Unsupported with Eyes Closed Able to stand 10 seconds safely    Standing Unsupported with Feet Together Able to place feet together independently and stand 1 minute safely    From Standing, Reach Forward with Outstretched Arm Can reach confidently >25 cm (10")    From Standing Position, Pick up Object from Floor Able to pick up shoe, needs supervision    From Standing Position, Turn to Look Behind Over each Shoulder Looks behind from both sides and weight shifts well    Turn 360 Degrees Able to turn 360 degrees safely one side only in 4 seconds or less    Standing Unsupported, Alternately Place Feet on Step/Stool Able to complete 4 steps without aid or supervision    Standing Unsupported, One Foot in Front Able to plae foot ahead of the other independently and hold 30 seconds    Standing on One Leg Tries to lift leg/unable to hold 3 seconds but remains standing independently    Total Score 47                TODAY'S TREATMENT  Patient education on current condition, role of PT, prognosis, plan of care.   Patient education on falls risk and strategies to make her home environment safer; recommended SPC use when going outside of her home.     PATIENT EDUCATION:  Education details: see above for patient education details  Person educated: Patient Education method: Explanation and Handouts Education comprehension: verbalized understanding   HOME EXERCISE PROGRAM: Access Code: [email or web address] URL: [email or web address] Date: 11/27/2022 Prepared by: Valentina Gu  Exercises - Sit to Stand with Counter Support  - 2 x daily - 7 x weekly - 2 sets - 10 reps - Heel Toe Raises with Counter Support  - 2 x daily - 7 x weekly - 2 sets - 10 reps - Standing March with Counter Support  - 2 x daily - 7 x weekly - 2 sets - 10 reps  Patient Education - What You Can Do to Prevent Falls   ASSESSMENT:  CLINICAL IMPRESSION: Patient is a 77 y.o. female who was seen today for physical therapy evaluation and treatment for imbalance and history of repeated falls. Objective impairments include {opptimpairments:25111}. These impairments are limiting patient from {activity limitations:25113}. Personal factors including {Personal factors:25162} are also affecting patient's functional outcome. Patient will benefit from skilled PT to address above impairments and improve overall function.  REHAB POTENTIAL: Good  CLINICAL DECISION MAKING: Evolving/moderate complexity  EVALUATION COMPLEXITY: High   GOALS: Goals reviewed with patient? No  SHORT TERM GOALS: Target date: 12/18/2022  Pt will be independent with HEP in order to improve strength and balance in order  to decrease fall risk and improve function at home. Baseline: 11/27/22: Baseline HEP initiated Goal status: INITIAL   LONG TERM GOALS: Target date: 01/22/2023  Pt will increase FOTO to at least 70 to demonstrate significant  improvement in function at home related to balance  Baseline: 11/27/22: 64 Goal status: INITIAL  2.  Pt will improve BERG by at least 3 points in order to demonstrate clinically significant improvement in balance.   Baseline: 11/27/22: BERG 47/56.  Goal status: INITIAL  3.  Pt will improve ABC by at least 13% in order to demonstrate clinically significant improvement in balance confidence.      Baseline: 11/27/22: 36.9% Goal status: INITIAL  4. Pt will decrease 5TSTS by at least 3 seconds in order to demonstrate clinically significant improvement in LE strength      Baseline: 11/27/22: 18 sec Goal status: INITIAL  5. Pt will improve DGI by at least 3 points in order to demonstrate clinically significant improvement in balance and decreased risk for falls.     Baseline: 11/27/22: Deferred to visit # 2.  Goal status: INITIAL  6. Pt will decrease TUG to below 12 seconds/decrease in order to demonstrate decreased fall risk.  Baseline: 11/27/22: 13.6 sec Goal status: INITIAL  7. Pt will increase 6MWT by at least 35m (151ft) in order to demonstrate clinically significant improvement in cardiopulmonary endurance and community ambulation  Baseline: 11/27/22: Deferred to visit # 2 Goal status: INITIAL   PLAN: PT FREQUENCY: 2x/week  PT DURATION: 8 weeks  PLANNED INTERVENTIONS: Therapeutic exercises, Therapeutic activity, Neuromuscular re-education, Balance training, Gait training, Patient/Family education, Electrical stimulation  PLAN FOR NEXT SESSION: ***   Valentina Gu, PT, DPT [id number]  Eilleen Kempf 11/27/2022, 12:50 PM

## 2022-11-29 ENCOUNTER — Ambulatory Visit: Payer: Medicare HMO | Admitting: Physical Therapy

## 2022-11-29 DIAGNOSIS — Z9181 History of falling: Secondary | ICD-10-CM

## 2022-11-29 DIAGNOSIS — R2689 Other abnormalities of gait and mobility: Secondary | ICD-10-CM

## 2022-11-29 DIAGNOSIS — R262 Difficulty in walking, not elsewhere classified: Secondary | ICD-10-CM

## 2022-11-29 DIAGNOSIS — M6281 Muscle weakness (generalized): Secondary | ICD-10-CM

## 2022-11-29 NOTE — Therapy (Signed)
OUTPATIENT PHYSICAL THERAPY TREATMENT NOTE   Patient Name: Barbara Ballard MRN: [id number] DOB:1946-01-14, 77 y.o., female Today's Date: 11/29/2022  PCP: [facility], [place]  REFERRING PROVIDER: Ardyth Man, PA-C   END OF SESSION:   PT End of Session - 11/29/22 1350     Visit Number 2    Number of Visits 17    Date for PT Re-Evaluation 01/22/23    Authorization Type [employer] Medicare 2024    Progress Note Due on Visit 10    PT Start Time 1300    PT Stop Time 1344    PT Time Calculation (min) 44 min    Equipment Utilized During Treatment Gait belt    Activity Tolerance Patient tolerated treatment well    Behavior During Therapy WFL for tasks assessed/performed             Past Medical History:  Diagnosis Date   Depression    High cholesterol    Hypertension    Restless leg    Past Surgical History:  Procedure Laterality Date   BUNIONECTOMY     CHOLECYSTECTOMY     Patient Active Problem List   Diagnosis Date Noted   Depression 03/21/2022   Essential hypertension 03/21/2022   Hyperlipidemia 03/21/2022   Restless leg syndrome 03/21/2022   Preoperative evaluation to rule out surgical contraindication 07/17/2021   Dermatochalasis of upper and lower eyelids of both eyes 02/23/2021   Diplopia 02/23/2021   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   Nuclear sclerotic cataract of both eyes 02/23/2021   Bilateral sensorineural hearing loss 09/09/2020   Orthostatic hypotension 07/15/2020   Grief 12/23/2019   Hypokalemia 12/23/2019   Osteopenia of spine 08/04/2019   Moderate episode of recurrent major depressive disorder ([facility]) 08/12/2018   Dental decay 03/19/2018   Closed displaced fracture of distal phalanx of left middle finger with routine healing 12/24/2016   Fracture of lumbar spine ([facility]) 04/11/2014   AK (actinic keratosis) 11/04/2012   Neuropathy 05/05/2012   Personal history of other malignant neoplasm of skin  04/22/2012    REFERRING DIAG:  R29.6 (ICD-10-CM) - Repeated falls    THERAPY DIAG:  History of falling  Difficulty in walking, not elsewhere classified  Muscle weakness (generalized)  Imbalance  Rationale for Evaluation and Treatment Rehabilitation  PERTINENT HISTORY: Patient is a 77 year old female referred to outpatient PT for frequent falls following hospital discharge (pt was admitted 10/16/22 due to pneumonia and had positive urinalysis for infection). Patient currently reports that her condition has improved, but she still has not gotten her voice back. Pt denies chest pain or SOB with getting around. Pt reports difficulty with balance and fear of falling. She reports unsteadiness on her feet that is worse after prolonged sitting. Pt reports LOB with bending down e.g. doing gardening and trying to get up. She reports no major change since hospitalization with this. Pt reports neuropathy affecting her feet. Patient reports      Pain: No Numbness/Tingling: Yes, numbness in feet and paresthesias in feet/toes  Focal Weakness: No Recent changes in overall health/medication: Yes, hospitalization in November  Prior history of physical therapy for balance:  Yes, history of PT for balance in [facility]16  Falls: Has patient fallen in last 6 months? Yes, Number of falls: 3-4 times, on injurious falls  Directional pattern for falls: Yes, forward  Imaging: No  Prior level of function: Independent Occupational demands: N/A Hobbies: walking, gardening    Red flags (bowel/bladder changes, saddle paresthesia,  personal history of cancer, h/o spinal tumors, h/o compression fx, h/o abdominal aneurysm, abdominal pain, chills/fever, night sweats, nausea, vomiting, unrelenting pain): Positive for history of skin cancer only on patient's chart   Weight Bearing Restrictions: No   Living Environment Lives with: lives alone, her son lives nearby; daughter lives in [place]  Lives in: House/apartment,  home  is 2 levels. She has spiral staircase to get to 2nd floor with handrail on one side. Her bedroom/bathroom is downstairs.  2 steps to get into home at back entrance, handrail on both sides. Has following equipment at home: Single point cane, shower chair, Grab bars, and bars for toilet transfer      Patient Goals: "Strengthen my balance", more endurance     PRECAUTIONS: Fall history  SUBJECTIVE:                                                                                                                                                                                      SUBJECTIVE STATEMENT:  Pt reports tolerating her eval well. Pt reports no new complaints at arrival. She reports no recent incidents or near-falls. Pt is compliant with HEP.      OBJECTIVE: (objective measures completed at initial evaluation unless otherwise dated)   Patient Surveys  FOTO: 76 ,predicted improvement to 70 ABC: 36.9%     GAIT: Distance walked: 150 Assistive device utilized: None Level of assistance: SBA Comments: Decreased arm swing and truncal rigidity, mild forward head and kyphotic posture maintained throughout gait cycle. Pt demonstrates decreased step length and increased cadence. Pt has sound heel strike bilaterally. No pelvic asymmetry or pelvic drop.      Posture: FHRS posture, increased thoracic kyphosis       LE MMT:   MMT (out of 5) Right 11/27/2022 Left 11/27/2022  Hip flexion 4- 4-  Hip extension      Hip abduction (seated) 4 4  Hip adduction (seated)  5 5  Hip internal rotation      Hip external rotation      Knee flexion 5 4+  Knee extension 4+ 5-  Ankle dorsiflexion 4+ 4+  Ankle plantarflexion      Ankle inversion      Ankle eversion      (* = pain; Blank rows = not tested)     Sensation Loss of light touch sensation in bilateral digits of feet      Reflexes Deferred     Cranial Nerves Deferred     Coordination/Cerebellar Finger to Nose: WNL Heel to  Shin: WNL Rapid alternating movements: WNL Finger Opposition: WNL Pronator Drift: Negative     FUNCTIONAL OUTCOME MEASURES  Results Comments  BERG 47/56 Fall risk, in need of intervention  DGI 15/24    TUG 13.63 seconds Fall risk, in need of intervention    5TSTS 18 seconds Fall risk, in need of intervention    6 Minute Walk Test 980 ft    (Blank rows = not tested)       TODAY'S TREATMENT      Neuromuscular Re-education - for improved sensory integration, static and dynamic postural control, equilibrium and non-equilibrium coordination as needed for negotiating home and community environment and stepping over obstacles    (All exercises performed with careful CGA)   Performance of DGI   In parallel bars:  Toe tapping, 6-inch step; alternating R/L, 2x10  Hurdle step over yard stick; 1x10 with each LE  Forward and retro stepping; 4x D/B  Semitandem standing; 2x30 sec in each position (RLE in back and LLE in back)    Therapeutic Exercise - improved strength as needed to improve performance of CKC activities/functional movements and as needed for power production to prevent fall during episode of large postural perturbation  Performance of 6-minute walk test   PATIENT EDUCATION: Reviewed results of functional outcome measures performed and expected progress in these measures with PT intervention.      PATIENT EDUCATION:  Education details: see above for patient education details   Person educated: Patient Education method: Explanation and Handouts Education comprehension: verbalized understanding     HOME EXERCISE PROGRAM: Access Code: [email or web address] URL: [email or web address] Date: 11/27/2022 Prepared by: Valentina Gu   Exercises - Sit to Stand with Counter Support  - 2 x daily - 7 x weekly - 2 sets - 10 reps - Heel Toe Raises with Counter Support  - 2 x daily - 7 x weekly - 2 sets - 10 reps - Standing March with Counter Support  - 2 x daily  - 7 x weekly - 2 sets - 10 reps   Patient Education - What You Can Do to Prevent Falls     ASSESSMENT:   CLINICAL IMPRESSION: Patient arrives with excellent motivation to participate in physical therapy. Pt does have 6-minute walk below normative values relative to age and gender. Her DGI score is indicative of fall risk. Pt is notably challenged with scanning environment/horizontal head turns with gait. Pt has significant LOB with clearing obstacles and weight-shifting tasks requiring brief unipedal stance. Pt needs further work on LE strengthening and balance/equilibrium coordination training. Patient has remaining deficits in postural control, lower extremity strength, equilibrium lower limb coordination, and impaired proprioception/kinesthetic sense of lower limbs associated with neuropathy. Patient will benefit from continued skilled therapeutic intervention to address the above deficits as needed for improved function and QoL.     REHAB POTENTIAL: Good   CLINICAL DECISION MAKING: Evolving/moderate complexity   EVALUATION COMPLEXITY: High     GOALS: Goals reviewed with patient? No   SHORT TERM GOALS: Target date: 12/18/2022   Pt will be independent with HEP in order to improve strength and balance in order to decrease fall risk and improve function at home. Baseline: 11/27/22: Baseline HEP initiated Goal status: INITIAL     LONG TERM GOALS: Target date: 01/22/2023   Pt will increase FOTO to at least 70 to demonstrate significant improvement in function at home related to balance  Baseline: 11/27/22: 64 Goal status: INITIAL   2.  Pt will improve BERG by at least 3 points in order to demonstrate clinically significant improvement in balance.   Baseline: 11/27/22: BERG 47/56.  Goal status: INITIAL   3.  Pt will improve ABC by at least 13% in order to demonstrate clinically significant improvement in balance confidence.      Baseline: 11/27/22: 36.9% Goal status: INITIAL   4. Pt  will decrease 5TSTS by at least 3 seconds in order to demonstrate clinically significant improvement in LE strength      Baseline: 11/27/22: 18 sec Goal status: INITIAL   5. Pt will improve DGI by at least 3 points in order to demonstrate clinically significant improvement in balance and decreased risk for falls.     Baseline: 11/27/22: Deferred to visit # 2.      11/29/22: 15/24 Goal status: INITIAL   6. Pt will decrease TUG to below 12 seconds/decrease in order to demonstrate decreased fall risk.  Baseline: 11/27/22: 13.6 sec Goal status: INITIAL   7. Pt will increase by at least 37m (156ft) in order to demonstrate clinically significant improvement in cardiopulmonary endurance and community ambulation  Baseline: 11/27/22: Deferred to visit # 2.     11/29/22: 980 feet.   Goal status: INITIAL     PLAN: PT FREQUENCY: 2x/week   PT DURATION: 8 weeks   PLANNED INTERVENTIONS: Therapeutic exercises, Therapeutic activity, Neuromuscular re-education, Balance training, Gait training, Patient/Family education, Electrical stimulation   PLAN FOR NEXT SESSION: Continued with strategies for weight-shifting and static and dynamic balance drills. Complete drills encouraging longer steps and bending/squatting to improve stability with these specific activities. Train multi-directional stepping to improve equilibrium coordination.     Consuela Mimes, PT, DPT #[id number]  Gertie Exon, PT 11/29/2022, 1:50 PM

## 2022-12-04 ENCOUNTER — Ambulatory Visit: Payer: Medicare HMO | Admitting: Physical Therapy

## 2022-12-04 ENCOUNTER — Encounter: Payer: Self-pay | Admitting: Physical Therapy

## 2022-12-04 DIAGNOSIS — Z9181 History of falling: Secondary | ICD-10-CM | POA: Diagnosis not present

## 2022-12-04 DIAGNOSIS — R262 Difficulty in walking, not elsewhere classified: Secondary | ICD-10-CM

## 2022-12-04 DIAGNOSIS — M6281 Muscle weakness (generalized): Secondary | ICD-10-CM

## 2022-12-04 DIAGNOSIS — R2689 Other abnormalities of gait and mobility: Secondary | ICD-10-CM

## 2022-12-04 NOTE — Therapy (Signed)
OUTPATIENT PHYSICAL THERAPY TREATMENT NOTE   Patient Name: Barbara Ballard MRN: [id number] DOB:May 01, 1946, 77 y.o., female Today's Date: 12/04/2022  PCP: [facility], [place]  REFERRING PROVIDER: Ardyth Man, PA-C   END OF SESSION:   PT End of Session - 12/04/22 1303     Visit Number 3    Number of Visits 17    Date for PT Re-Evaluation 01/22/23    Authorization Type [employer] 2024    Progress Note Due on Visit 10    PT Start Time 1259    PT Stop Time 1342    PT Time Calculation (min) 43 min    Equipment Utilized During Treatment Gait belt    Activity Tolerance Patient tolerated treatment well    Behavior During Therapy WFL for tasks assessed/performed              Past Medical History:  Diagnosis Date   Depression    High cholesterol    Hypertension    Restless leg    Past Surgical History:  Procedure Laterality Date   BUNIONECTOMY     CHOLECYSTECTOMY     Patient Active Problem List   Diagnosis Date Noted   Depression 03/21/2022   Essential hypertension 03/21/2022   Hyperlipidemia 03/21/2022   Restless leg syndrome 03/21/2022   Preoperative evaluation to rule out surgical contraindication 07/17/2021   Dermatochalasis of upper and lower eyelids of both eyes 02/23/2021   Diplopia 02/23/2021   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   Nuclear sclerotic cataract of both eyes 02/23/2021   Bilateral sensorineural hearing loss 09/09/2020   Orthostatic hypotension 07/15/2020   Grief 12/23/2019   Hypokalemia 12/23/2019   Osteopenia of spine 08/04/2019   Moderate episode of recurrent major depressive disorder (HCC) 08/12/2018   Dental decay 03/19/2018   Closed displaced fracture of distal phalanx of left middle finger with routine healing 12/24/2016   Fracture of lumbar spine (HCC) 04/11/2014   AK (actinic keratosis) 11/04/2012   Neuropathy 05/05/2012   Personal history of other malignant neoplasm of  skin 04/22/2012    REFERRING DIAG:  R29.6 (ICD-10-CM) - Repeated falls    THERAPY DIAG:  History of falling  Difficulty in walking, not elsewhere classified  Muscle weakness (generalized)  Imbalance  Rationale for Evaluation and Treatment Rehabilitation  PERTINENT HISTORY: Patient is a 77 year old female referred to outpatient PT for frequent falls following hospital discharge (pt was admitted 10/16/22 due to pneumonia and had positive urinalysis for infection). Patient currently reports that her condition has improved, but she still has not gotten her voice back. Pt denies chest pain or SOB with getting around. Pt reports difficulty with balance and fear of falling. She reports unsteadiness on her feet that is worse after prolonged sitting. Pt reports LOB with bending down e.g. doing gardening and trying to get up. She reports no major change since hospitalization with this. Pt reports neuropathy affecting her feet. Patient reports      Pain: No Numbness/Tingling: Yes, numbness in feet and paresthesias in feet/toes  Focal Weakness: No Recent changes in overall health/medication: Yes, hospitalization in November  Prior history of physical therapy for balance:  Yes, history of PT for balance in 2916  Falls: Has patient fallen in last 6 months? Yes, Number of falls: 3-4 times, on injurious falls  Directional pattern for falls: Yes, forward  Imaging: No  Prior level of function: Independent Occupational demands: N/A Hobbies: walking, gardening    Red flags (bowel/bladder changes, saddle  paresthesia, personal history of cancer, h/o spinal tumors, h/o compression fx, h/o abdominal aneurysm, abdominal pain, chills/fever, night sweats, nausea, vomiting, unrelenting pain): Positive for history of skin cancer only on patient's chart   Weight Bearing Restrictions: No   Living Environment Lives with: lives alone, her son lives nearby; daughter lives in [place]  Lives in: House/apartment,   home is 2 levels. She has spiral staircase to get to 2nd floor with handrail on one side. Her bedroom/bathroom is downstairs.  2 steps to get into home at back entrance, handrail on both sides. Has following equipment at home: Single point cane, shower chair, Grab bars, and bars for toilet transfer      Patient Goals: "Strengthen my balance", more endurance     PRECAUTIONS: Fall history  SUBJECTIVE:                                                                                                                                                                                      SUBJECTIVE STATEMENT:  Pt reports no major issues after her last visit. She reports no major updates or recent near-falls/safety incidents at home.      OBJECTIVE: (objective measures completed at initial evaluation unless otherwise dated)   Patient Surveys  FOTO: 72 ,predicted improvement to 70 ABC: 36.9%     GAIT: Distance walked: 150 Assistive device utilized: None Level of assistance: SBA Comments: Decreased arm swing and truncal rigidity, mild forward head and kyphotic posture maintained throughout gait cycle. Pt demonstrates decreased step length and increased cadence. Pt has sound heel strike bilaterally. No pelvic asymmetry or pelvic drop.      Posture: FHRS posture, increased thoracic kyphosis       LE MMT:   MMT (out of 5) Right 11/27/2022 Left 11/27/2022  Hip flexion 4- 4-  Hip extension      Hip abduction (seated) 4 4  Hip adduction (seated)  5 5  Hip internal rotation      Hip external rotation      Knee flexion 5 4+  Knee extension 4+ 5-  Ankle dorsiflexion 4+ 4+  Ankle plantarflexion      Ankle inversion      Ankle eversion      (* = pain; Blank rows = not tested)     Sensation Loss of light touch sensation in bilateral digits of feet      Reflexes Deferred     Cranial Nerves Deferred     Coordination/Cerebellar Finger to Nose: WNL Heel to Shin: WNL Rapid  alternating movements: WNL Finger Opposition: WNL Pronator Drift: Negative     FUNCTIONAL OUTCOME MEASURES     Results  Comments  BERG 47/56 Fall risk, in need of intervention  DGI 15/24    TUG 13.63 seconds Fall risk, in need of intervention    5TSTS 18 seconds Fall risk, in need of intervention    6 Minute Walk Test 980 ft    (Blank rows = not tested)       TODAY'S TREATMENT      Neuromuscular Re-education - for improved sensory integration, static and dynamic postural control, equilibrium and non-equilibrium coordination as needed for negotiating home and community environment and stepping over obstacles    (All exercises performed with careful CGA)   In parallel bars: Forward and retro stepping; 5x D/B High knees; 5x D/B  Toe tapping, 6-inch step; alternating R/L, 2x10  Hurdle step over yard stick; 1x10 with each LE   -heavy cueing for heel to toe stepping; cued for decreased upper limb support  Standing feet together on Airex; 2x30 sec  Semitandem standing; 2x30 sec in each position (RLE in back and LLE in back)    Standing feet together with perturbations; x 1 minutes     Therapeutic Exercise - improved strength as needed to improve performance of CKC activities/functional movements and as needed for power production to prevent fall during episode of large postural perturbation   NuStep; 5 minutes; Level 2, seat at 8, arms at 10  3-way hip in standing; BUE support in parallel bars; x8 ea dir   PATIENT EDUCATION: HEP update and review    PATIENT EDUCATION:  Education details: see above for patient education details   Person educated: Patient Education method: Explanation and Handouts Education comprehension: verbalized understanding     HOME EXERCISE PROGRAM: Access Code: [email or web address] URL: [email or web address] Date: 12/04/2022 Prepared by: Valentina Gu  Exercises - Sit to Stand with Counter Support  - 2 x daily - 7 x weekly - 2 sets -  10 reps - Heel Toe Raises with Counter Support  - 2 x daily - 7 x weekly - 2 sets - 10 reps - Standing March with Counter Support  - 2 x daily - 7 x weekly - 2 sets - 10 reps - Standing Hip Abduction with Unilateral Counter Support  - 2 x daily - 7 x weekly - 2 sets - 10 reps     ASSESSMENT:   CLINICAL IMPRESSION: We continued today with focus on postural control training and dynamic balance drills focused on stability with stepping and clearing obstacles. Patient is significantly challenged with exercises requiring brief unipedal stance. Pt is usually able to prevent fall without assist with use of parallel bars, though MinA is given intermittently with balance drills.  Patient has remaining deficits in postural control, lower extremity strength, equilibrium lower limb coordination, and impaired proprioception/kinesthetic sense of lower limbs associated with neuropathy. Patient will benefit from continued skilled therapeutic intervention to address the above deficits as needed for improved function and QoL.     REHAB POTENTIAL: Good   CLINICAL DECISION MAKING: Evolving/moderate complexity   EVALUATION COMPLEXITY: High     GOALS: Goals reviewed with patient? No   SHORT TERM GOALS: Target date: 12/18/2022   Pt will be independent with HEP in order to improve strength and balance in order to decrease fall risk and improve function at home. Baseline: 11/27/22: Baseline HEP initiated Goal status: INITIAL     LONG TERM GOALS: Target date: 01/22/2023   Pt will increase FOTO to at least 70 to demonstrate significant improvement in function at home related to balance  Baseline: 11/27/22: 64 Goal status: INITIAL   2.  Pt will improve BERG by at least 3 points in order to demonstrate clinically significant improvement in balance.   Baseline: 11/27/22: BERG 47/56.  Goal status: INITIAL   3.  Pt will improve ABC by at least 13% in order to demonstrate clinically significant improvement in balance  confidence.      Baseline: 11/27/22: 36.9% Goal status: INITIAL   4. Pt will decrease 5TSTS by at least 3 seconds in order to demonstrate clinically significant improvement in LE strength      Baseline: 11/27/22: 18 sec Goal status: INITIAL   5. Pt will improve DGI by at least 3 points in order to demonstrate clinically significant improvement in balance and decreased risk for falls.     Baseline: 11/27/22: Deferred to visit # 2.      11/29/22: 15/24 Goal status: INITIAL   6. Pt will decrease TUG to below 12 seconds/decrease in order to demonstrate decreased fall risk.  Baseline: 11/27/22: 13.6 sec Goal status: INITIAL   7. Pt will increase by at least 53m (171ft) in order to demonstrate clinically significant improvement in cardiopulmonary endurance and community ambulation  Baseline: 11/27/22: Deferred to visit # 2.     11/29/22: 980 feet.   Goal status: INITIAL      PLAN: PT FREQUENCY: 2x/week   PT DURATION: 8 weeks   PLANNED INTERVENTIONS: Therapeutic exercises, Therapeutic activity, Neuromuscular re-education, Balance training, Gait training, Patient/Family education, Electrical stimulation   PLAN FOR NEXT SESSION: Continued with strategies for weight-shifting and static and dynamic balance drills. Complete drills encouraging longer steps and bending/squatting to improve stability with these specific activities. Train multi-directional stepping to improve equilibrium coordination.     Consuela Mimes, PT, DPT #[id number]  Gertie Exon, PT 12/04/2022, 1:03 PM

## 2022-12-06 ENCOUNTER — Ambulatory Visit: Payer: Medicare HMO | Admitting: Physical Therapy

## 2022-12-06 ENCOUNTER — Encounter: Payer: Self-pay | Admitting: Physical Therapy

## 2022-12-06 DIAGNOSIS — R262 Difficulty in walking, not elsewhere classified: Secondary | ICD-10-CM

## 2022-12-06 DIAGNOSIS — Z9181 History of falling: Secondary | ICD-10-CM | POA: Diagnosis not present

## 2022-12-06 DIAGNOSIS — R2689 Other abnormalities of gait and mobility: Secondary | ICD-10-CM

## 2022-12-06 DIAGNOSIS — M6281 Muscle weakness (generalized): Secondary | ICD-10-CM

## 2022-12-06 NOTE — Therapy (Signed)
OUTPATIENT PHYSICAL THERAPY TREATMENT NOTE   Patient Name: Barbara Ballard MRN: [id number] DOB:October 04, 1946, 77 y.o., female Today's Date: 12/06/2022  PCP: [facility] [place] PROVIDER: Hilton Sinclair, PA-C   END OF SESSION:   PT End of Session - 12/10/22 0552     Visit Number 4    Number of Visits 17    Date for PT Re-Evaluation 01/22/23    Authorization Type [employer] 2024    Progress Note Due on Visit 10    PT Start Time 0940    PT Stop Time 1025    PT Time Calculation (min) 45 min    Equipment Utilized During Treatment Gait belt    Activity Tolerance Patient tolerated treatment well    Behavior During Therapy [facility] for tasks assessed/performed               Past Medical History:  Diagnosis Date   Depression    High cholesterol    Hypertension    Restless leg    Past Surgical History:  Procedure Laterality Date   BUNIONECTOMY     CHOLECYSTECTOMY     Patient Active Problem List   Diagnosis Date Noted   Depression 03/21/2022   Essential hypertension 03/21/2022   Hyperlipidemia 03/21/2022   Restless leg syndrome 03/21/2022   Preoperative evaluation to rule out surgical contraindication 07/17/2021   Dermatochalasis of upper and lower eyelids of both eyes 02/23/2021   Diplopia 02/23/2021   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   Nuclear sclerotic cataract of both eyes 02/23/2021   Bilateral sensorineural hearing loss 09/09/2020   Orthostatic hypotension 07/15/2020   Grief 12/23/2019   Hypokalemia 12/23/2019   Osteopenia of spine 08/04/2019   Moderate episode of recurrent major depressive disorder ([place]) 08/12/2018   Dental decay 03/19/2018   Closed displaced fracture of distal phalanx of left middle finger with routine healing 12/24/2016   Fracture of lumbar spine ([place]) 04/11/2014   AK (actinic keratosis) 11/04/2012   Neuropathy 05/05/2012   Personal history of other malignant neoplasm of  skin 04/22/2012    REFERRING DIAG:  R29.6 (ICD-10-CM) - Repeated falls    THERAPY DIAG:  History of falling  Difficulty in walking, not elsewhere classified  Muscle weakness (generalized)  Imbalance  Rationale for Evaluation and Treatment Rehabilitation  PERTINENT HISTORY: Patient is a 77 year old female referred to outpatient PT for frequent falls following hospital discharge (pt was admitted 10/16/22 due to pneumonia and had positive urinalysis for infection). Patient currently reports that her condition has improved, but she still has not gotten her voice back. Pt denies chest pain or SOB with getting around. Pt reports difficulty with balance and fear of falling. She reports unsteadiness on her feet that is worse after prolonged sitting. Pt reports LOB with bending down e.g. doing gardening and trying to get up. She reports no major change since hospitalization with this. Pt reports neuropathy affecting her feet. Patient reports      Pain: No Numbness/Tingling: Yes, numbness in feet and paresthesias in feet/toes  Focal Weakness: No Recent changes in overall health/medication: Yes, hospitalization in November  Prior history of physical therapy for balance:  Yes, history of PT for balance in 2916  Falls: Has patient fallen in last 6 months? Yes, Number of falls: 3-4 times, on injurious falls  Directional pattern for falls: Yes, forward  Imaging: No  Prior level of function: Independent Occupational demands: N/A Hobbies: walking, gardening    Red flags (bowel/bladder changes,  saddle paresthesia, personal history of cancer, h/o spinal tumors, h/o compression fx, h/o abdominal aneurysm, abdominal pain, chills/fever, night sweats, nausea, vomiting, unrelenting pain): Positive for history of skin cancer only on patient's chart   Weight Bearing Restrictions: No   Living Environment Lives with: lives alone, her son lives nearby; daughter lives in [place]  Lives in: House/apartment,   home is 2 levels. She has spiral staircase to get to 2nd floor with handrail on one side. Her bedroom/bathroom is downstairs.  2 steps to get into home at back entrance, handrail on both sides. Has following equipment at home: Single point cane, shower chair, Grab bars, and bars for toilet transfer      Patient Goals: "Strengthen my balance", more endurance     PRECAUTIONS: Fall history  SUBJECTIVE:                                                                                                                                                                                      SUBJECTIVE STATEMENT:  Pt reports no significant changes since last visit. Pt reports tolerating last session well and she reports no recent safety incidents or near-falls. Pt is compliant with updated HEP.      OBJECTIVE: (objective measures completed at initial evaluation unless otherwise dated)   Patient Surveys  FOTO: 69 ,predicted improvement to 70 ABC: 36.9%     GAIT: Distance walked: 150 Assistive device utilized: None Level of assistance: SBA Comments: Decreased arm swing and truncal rigidity, mild forward head and kyphotic posture maintained throughout gait cycle. Pt demonstrates decreased step length and increased cadence. Pt has sound heel strike bilaterally. No pelvic asymmetry or pelvic drop.      Posture: FHRS posture, increased thoracic kyphosis       LE MMT:   MMT (out of 5) Right 11/27/2022 Left 11/27/2022  Hip flexion 4- 4-  Hip extension      Hip abduction (seated) 4 4  Hip adduction (seated)  5 5  Hip internal rotation      Hip external rotation      Knee flexion 5 4+  Knee extension 4+ 5-  Ankle dorsiflexion 4+ 4+  Ankle plantarflexion      Ankle inversion      Ankle eversion      (* = pain; Blank rows = not tested)     Sensation Loss of light touch sensation in bilateral digits of feet      Reflexes Deferred     Cranial Nerves Deferred      Coordination/Cerebellar Finger to Nose: WNL Heel to Shin: WNL Rapid alternating movements: WNL Finger Opposition: WNL Pronator Drift: Negative  FUNCTIONAL OUTCOME MEASURES     Results Comments  BERG 47/56 Fall risk, in need of intervention  DGI 15/24    TUG 13.63 seconds Fall risk, in need of intervention    5TSTS 18 seconds Fall risk, in need of intervention    6 Minute Walk Test 980 ft    (Blank rows = not tested)       TODAY'S TREATMENT      Neuromuscular Re-education - for improved sensory integration, static and dynamic postural control, equilibrium and non-equilibrium coordination as needed for negotiating home and community environment and stepping over obstacles    (All exercises performed with careful CGA)   In parallel bars: Forward and retro stepping; 5x D/B High knees; 5x D/B  Toe tapping, 6-inch step; alternating R/L, 2x10  Hurdle step over yard stick; 1x10 with each LE   -heavy cueing for heel to toe stepping; cued for decreased upper limb support  Semitandem standing; 2x30 sec in each position (RLE in back and LLE in back)  Consecutive hurdle forward step-over; (3) 6-inch hurdles; 4x D/B with step-to pattern Blue star 8-way multidirectional stepping; x 4 ea dir with bilat LE, intermittent cueing for technique   *next visit* Standing feet together with perturbations; x 1 minutes   *not today* Standing feet together on Airex; 2x30 sec   Therapeutic Exercise - improved strength as needed to improve performance of CKC activities/functional movements and as needed for power production to prevent fall during episode of large postural perturbation   NuStep; 5 minutes; Level 2, seat at 8, arms at 10   -subjective information gathered during this time   3-way hip in standing; BUE support in parallel bars, with 4-lb ankle weights; x8 ea dir   Sit to stand with Airex under feet; 2x8  PATIENT EDUCATION: HEP review    PATIENT EDUCATION:  Education  details: see above for patient education details   Person educated: Patient Education method: Explanation and Handouts Education comprehension: verbalized understanding     HOME EXERCISE PROGRAM: Access Code: [email or web address] URL: [email or web address] Date: 12/04/2022 Prepared by: Consuela Mimes  Exercises - Sit to Stand with Counter Support  - 2 x daily - 7 x weekly - 2 sets - 10 reps - Heel Toe Raises with Counter Support  - 2 x daily - 7 x weekly - 2 sets - 10 reps - Standing March with Counter Support  - 2 x daily - 7 x weekly - 2 sets - 10 reps - Standing Hip Abduction with Unilateral Counter Support  - 2 x daily - 7 x weekly - 2 sets - 10 reps     ASSESSMENT:   CLINICAL IMPRESSION: Patient demonstrates ongoing deficits with static postural control and with negotiating obstacles, though she is able to progress to hurdle stepping today with intermittent UE support on bars without significant LOB. We are continuing to increase intensity of exercise as needed for improved strength/power to prevent fall during episode of LOB. Pt participates well with PT, but she does need further work on ability to confidently negotiate obstacles. Patient has remaining deficits in postural control, lower extremity strength, equilibrium lower limb coordination, and impaired proprioception/kinesthetic sense of lower limbs associated with neuropathy. Patient will benefit from continued skilled therapeutic intervention to address the above deficits as needed for improved function and QoL.     REHAB POTENTIAL: Good   CLINICAL DECISION MAKING: Evolving/moderate complexity   EVALUATION COMPLEXITY: High     GOALS: Goals reviewed with patient? No  SHORT TERM GOALS: Target date: 12/18/2022   Pt will be independent with HEP in order to improve strength and balance in order to decrease fall risk and improve function at home. Baseline: 11/27/22: Baseline HEP initiated Goal status: INITIAL      LONG TERM GOALS: Target date: 01/22/2023   Pt will increase FOTO to at least 70 to demonstrate significant improvement in function at home related to balance  Baseline: 11/27/22: 64 Goal status: INITIAL   2.  Pt will improve BERG by at least 3 points in order to demonstrate clinically significant improvement in balance.   Baseline: 11/27/22: BERG 47/56.  Goal status: INITIAL   3.  Pt will improve ABC by at least 13% in order to demonstrate clinically significant improvement in balance confidence.      Baseline: 11/27/22: 36.9% Goal status: INITIAL   4. Pt will decrease 5TSTS by at least 3 seconds in order to demonstrate clinically significant improvement in LE strength      Baseline: 11/27/22: 18 sec Goal status: INITIAL   5. Pt will improve DGI by at least 3 points in order to demonstrate clinically significant improvement in balance and decreased risk for falls.     Baseline: 11/27/22: Deferred to visit # 2.      11/29/22: 15/24 Goal status: INITIAL   6. Pt will decrease TUG to below 12 seconds/decrease in order to demonstrate decreased fall risk.  Baseline: 11/27/22: 13.6 sec Goal status: INITIAL   7. Pt will increase 6MWT by at least 5m (159ft) in order to demonstrate clinically significant improvement in cardiopulmonary endurance and community ambulation  Baseline: 11/27/22: Deferred to visit # 2.     11/29/22: 980 feet.   Goal status: INITIAL      PLAN: PT FREQUENCY: 2x/week   PT DURATION: 8 weeks   PLANNED INTERVENTIONS: Therapeutic exercises, Therapeutic activity, Neuromuscular re-education, Balance training, Gait training, Patient/Family education, Electrical stimulation   PLAN FOR NEXT SESSION: Continued with strategies for weight-shifting and static and dynamic balance drills. Complete drills encouraging longer steps and bending/squatting to improve stability with these specific activities. Train multi-directional stepping to improve equilibrium coordination.     Valentina Gu,  PT, DPT #[id number]  Eilleen Kempf, PT 12/10/2022, 5:53 AM

## 2022-12-10 ENCOUNTER — Ambulatory Visit: Payer: Medicare HMO | Admitting: Physical Therapy

## 2022-12-10 ENCOUNTER — Encounter: Payer: Self-pay | Admitting: Physical Therapy

## 2022-12-10 DIAGNOSIS — Z9181 History of falling: Secondary | ICD-10-CM

## 2022-12-10 DIAGNOSIS — R2689 Other abnormalities of gait and mobility: Secondary | ICD-10-CM

## 2022-12-10 DIAGNOSIS — R262 Difficulty in walking, not elsewhere classified: Secondary | ICD-10-CM

## 2022-12-10 DIAGNOSIS — M6281 Muscle weakness (generalized): Secondary | ICD-10-CM

## 2022-12-10 NOTE — Therapy (Signed)
OUTPATIENT PHYSICAL THERAPY TREATMENT NOTE   Patient Name: Barbara Ballard MRN: [id number] DOB:1946/08/28, 77 y.o., female Today's Date: 12/06/2022  PCP: [facility], [place]  REFERRING PROVIDER: Ardyth Man, PA-C   END OF SESSION:   PT End of Session - 12/10/22 1020     Visit Number 5    Number of Visits 17    Date for PT Re-Evaluation 01/22/23    Authorization Type [employer] 2024    Progress Note Due on Visit 10    PT Start Time 1015    PT Stop Time 1057    PT Time Calculation (min) 42 min    Equipment Utilized During Treatment Gait belt    Activity Tolerance Patient tolerated treatment well    Behavior During Therapy WFL for tasks assessed/performed               Past Medical History:  Diagnosis Date   Depression    High cholesterol    Hypertension    Restless leg    Past Surgical History:  Procedure Laterality Date   BUNIONECTOMY     CHOLECYSTECTOMY     Patient Active Problem List   Diagnosis Date Noted   Depression 03/21/2022   Essential hypertension 03/21/2022   Hyperlipidemia 03/21/2022   Restless leg syndrome 03/21/2022   Preoperative evaluation to rule out surgical contraindication 07/17/2021   Dermatochalasis of upper and lower eyelids of both eyes 02/23/2021   Diplopia 02/23/2021   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   Nuclear sclerotic cataract of both eyes 02/23/2021   Bilateral sensorineural hearing loss 09/09/2020   Orthostatic hypotension 07/15/2020   Grief 12/23/2019   Hypokalemia 12/23/2019   Osteopenia of spine 08/04/2019   Moderate episode of recurrent major depressive disorder (HCC) 08/12/2018   Dental decay 03/19/2018   Closed displaced fracture of distal phalanx of left middle finger with routine healing 12/24/2016   Fracture of lumbar spine (HCC) 04/11/2014   AK (actinic keratosis) 11/04/2012   Neuropathy 05/05/2012   Personal history of other malignant neoplasm of  skin 04/22/2012    REFERRING DIAG:  R29.6 (ICD-10-CM) - Repeated falls    THERAPY DIAG:  History of falling  Difficulty in walking, not elsewhere classified  Muscle weakness (generalized)  Imbalance  Rationale for Evaluation and Treatment Rehabilitation  PERTINENT HISTORY: Patient is a 77 year old female referred to outpatient PT for frequent falls following hospital discharge (pt was admitted 10/16/22 due to pneumonia and had positive urinalysis for infection). Patient currently reports that her condition has improved, but she still has not gotten her voice back. Pt denies chest pain or SOB with getting around. Pt reports difficulty with balance and fear of falling. She reports unsteadiness on her feet that is worse after prolonged sitting. Pt reports LOB with bending down e.g. doing gardening and trying to get up. She reports no major change since hospitalization with this. Pt reports neuropathy affecting her feet. Patient reports      Pain: No Numbness/Tingling: Yes, numbness in feet and paresthesias in feet/toes  Focal Weakness: No Recent changes in overall health/medication: Yes, hospitalization in November  Prior history of physical therapy for balance:  Yes, history of PT for balance in 2916  Falls: Has patient fallen in last 6 months? Yes, Number of falls: 3-4 times, on injurious falls  Directional pattern for falls: Yes, forward  Imaging: No  Prior level of function: Independent Occupational demands: N/A Hobbies: walking, gardening    Red flags (bowel/bladder changes,  saddle paresthesia, personal history of cancer, h/o spinal tumors, h/o compression fx, h/o abdominal aneurysm, abdominal pain, chills/fever, night sweats, nausea, vomiting, unrelenting pain): Positive for history of skin cancer only on patient's chart   Weight Bearing Restrictions: No   Living Environment Lives with: lives alone, her son lives nearby; daughter lives in [place]  Lives in: House/apartment,   home is 2 levels. She has spiral staircase to get to 2nd floor with handrail on one side. Her bedroom/bathroom is downstairs.  2 steps to get into home at back entrance, handrail on both sides. Has following equipment at home: Single point cane, shower chair, Grab bars, and bars for toilet transfer      Patient Goals: "Strengthen my balance", more endurance     PRECAUTIONS: Fall history  SUBJECTIVE:                                                                                                                                                                                      SUBJECTIVE STATEMENT:  Pt reports no significant changes today. No recent falls or near-falls.      OBJECTIVE: (objective measures completed at initial evaluation unless otherwise dated)   Patient Surveys  FOTO: 16 ,predicted improvement to 70 ABC: 36.9%     GAIT: Distance walked: 150 Assistive device utilized: None Level of assistance: SBA Comments: Decreased arm swing and truncal rigidity, mild forward head and kyphotic posture maintained throughout gait cycle. Pt demonstrates decreased step length and increased cadence. Pt has sound heel strike bilaterally. No pelvic asymmetry or pelvic drop.      Posture: FHRS posture, increased thoracic kyphosis       LE MMT:   MMT (out of 5) Right 11/27/2022 Left 11/27/2022  Hip flexion 4- 4-  Hip extension      Hip abduction (seated) 4 4  Hip adduction (seated)  5 5  Hip internal rotation      Hip external rotation      Knee flexion 5 4+  Knee extension 4+ 5-  Ankle dorsiflexion 4+ 4+  Ankle plantarflexion      Ankle inversion      Ankle eversion      (* = pain; Blank rows = not tested)     Sensation Loss of light touch sensation in bilateral digits of feet      Reflexes Deferred     Cranial Nerves Deferred     Coordination/Cerebellar Finger to Nose: WNL Heel to Shin: WNL Rapid alternating movements: WNL Finger Opposition:  WNL Pronator Drift: Negative     FUNCTIONAL OUTCOME MEASURES     Results Comments  BERG 47/56 Fall risk, in need  of intervention  DGI 15/24    TUG 13.63 seconds Fall risk, in need of intervention    5TSTS 18 seconds Fall risk, in need of intervention    6 Minute Walk Test 980 ft    (Blank rows = not tested)       TODAY'S TREATMENT      Neuromuscular Re-education - for improved sensory integration, static and dynamic postural control, equilibrium and non-equilibrium coordination as needed for negotiating home and community environment and stepping over obstacles    (All exercises performed with careful CGA)   In parallel bars: Forward and retro stepping; 5x D/B High knees; 5x D/B  Toe tapping, 6-inch step; alternating R/L, 2x10  Hurdle step over 5-lb Dbell; 1x10 with each LE   -heavy cueing for heel to toe stepping; cued for decreased upper limb support  Consecutive hurdle forward step-over; (3) 6-inch hurdles; 4x D/B with step-to pattern Blue star 8-way multidirectional stepping; x 4 ea dir with bilat LE, intermittent cueing for technique Standing feet together with perturbations; x 1 minutes  Stride stance with perturbations, RLE in back and LLE in back; x 1 minute each  Semitandem standing; 1x45 sec in each position (RLE in back and LLE in back)  *not today* Standing feet together on Airex; 2x30 sec   Therapeutic Exercise - improved strength as needed to improve performance of CKC activities/functional movements and as needed for power production to prevent fall during episode of large postural perturbation   NuStep; 5 minutes; Level 2, seat at 8, arms at 10   -subjective information gathered during this time  Sit to stand with Airex under feet; 2x10  PATIENT EDUCATION: HEP review    *not today*  3-way hip in standing; BUE support in parallel bars, with 4-lb ankle weights; x8 ea dir     PATIENT EDUCATION:  Education details: see above for patient  education details   Person educated: Patient Education method: Explanation and Handouts Education comprehension: verbalized understanding     HOME EXERCISE PROGRAM: Access Code: [email or web address] URL: [email or web address] Date: 12/04/2022 Prepared by: Valentina Gu  Exercises - Sit to Stand with Counter Support  - 2 x daily - 7 x weekly - 2 sets - 10 reps - Heel Toe Raises with Counter Support  - 2 x daily - 7 x weekly - 2 sets - 10 reps - Standing March with Counter Support  - 2 x daily - 7 x weekly - 2 sets - 10 reps - Standing Hip Abduction with Unilateral Counter Support  - 2 x daily - 7 x weekly - 2 sets - 10 reps     ASSESSMENT:   CLINICAL IMPRESSION: Patient is able to markedly improve duration of static balance drills. She is able to respond appropriately with perturbations when standing feet together; position was progressed to stride stance with increased difficulty maintaining postural control. Pt does still have difficulty with obstacle negotiation and intermittent dysequilibrium with mult-directional stepping requiring further PT intervention. Patient has remaining deficits in postural control, lower extremity strength, equilibrium lower limb coordination, and impaired proprioception/kinesthetic sense of lower limbs associated with neuropathy. Patient will benefit from continued skilled therapeutic intervention to address the above deficits as needed for improved function and QoL.     REHAB POTENTIAL: Good   CLINICAL DECISION MAKING: Evolving/moderate complexity   EVALUATION COMPLEXITY: High     GOALS: Goals reviewed with patient? No   SHORT TERM GOALS: Target date: 12/18/2022   Pt will be independent with HEP in  order to improve strength and balance in order to decrease fall risk and improve function at home. Baseline: 11/27/22: Baseline HEP initiated Goal status: INITIAL     LONG TERM GOALS: Target date: 01/22/2023   Pt will increase FOTO to at least 70  to demonstrate significant improvement in function at home related to balance  Baseline: 11/27/22: 64 Goal status: INITIAL   2.  Pt will improve BERG by at least 3 points in order to demonstrate clinically significant improvement in balance.   Baseline: 11/27/22: BERG 47/56.  Goal status: INITIAL   3.  Pt will improve ABC by at least 13% in order to demonstrate clinically significant improvement in balance confidence.      Baseline: 11/27/22: 36.9% Goal status: INITIAL   4. Pt will decrease 5TSTS by at least 3 seconds in order to demonstrate clinically significant improvement in LE strength      Baseline: 11/27/22: 18 sec Goal status: INITIAL   5. Pt will improve DGI by at least 3 points in order to demonstrate clinically significant improvement in balance and decreased risk for falls.     Baseline: 11/27/22: Deferred to visit # 2.      11/29/22: 15/24 Goal status: INITIAL   6. Pt will decrease TUG to below 12 seconds/decrease in order to demonstrate decreased fall risk.  Baseline: 11/27/22: 13.6 sec Goal status: INITIAL   7. Pt will increase 6MWT by at least 37m (182ft) in order to demonstrate clinically significant improvement in cardiopulmonary endurance and community ambulation  Baseline: 11/27/22: Deferred to visit # 2.     11/29/22: 980 feet.   Goal status: INITIAL      PLAN: PT FREQUENCY: 2x/week   PT DURATION: 8 weeks   PLANNED INTERVENTIONS: Therapeutic exercises, Therapeutic activity, Neuromuscular re-education, Balance training, Gait training, Patient/Family education, Electrical stimulation   PLAN FOR NEXT SESSION: Continued with strategies for weight-shifting and static and dynamic balance drills. Complete drills encouraging longer steps and bending/squatting to improve stability with these specific activities. Train multi-directional stepping to improve equilibrium coordination.     Valentina Gu, PT, DPT #[id number]  Barbara Ballard, PT 12/10/2022, 10:20 AM

## 2022-12-11 ENCOUNTER — Encounter: Payer: Medicare HMO | Admitting: Physical Therapy

## 2022-12-13 ENCOUNTER — Encounter: Payer: Self-pay | Admitting: Physical Therapy

## 2022-12-13 ENCOUNTER — Ambulatory Visit: Payer: Medicare HMO | Admitting: Physical Therapy

## 2022-12-13 DIAGNOSIS — Z9181 History of falling: Secondary | ICD-10-CM

## 2022-12-13 DIAGNOSIS — M6281 Muscle weakness (generalized): Secondary | ICD-10-CM

## 2022-12-13 DIAGNOSIS — R2689 Other abnormalities of gait and mobility: Secondary | ICD-10-CM

## 2022-12-13 DIAGNOSIS — R262 Difficulty in walking, not elsewhere classified: Secondary | ICD-10-CM

## 2022-12-13 NOTE — Therapy (Signed)
OUTPATIENT PHYSICAL THERAPY TREATMENT NOTE   Patient Name: Barbara Ballard MRN: [id number] DOB:1946-01-03, 77 y.o., female Today's Date: 12/06/2022  PCP: [facility] [place] PROVIDER: Hilton Sinclair, PA-C   END OF SESSION:   PT End of Session - 12/13/22 1515     Visit Number 6    Number of Visits 17    Date for PT Re-Evaluation 01/22/23    Authorization Type [employer] 2024    Progress Note Due on Visit 10    PT Start Time 1515    PT Stop Time 1557    PT Time Calculation (min) 42 min    Equipment Utilized During Treatment Gait belt    Activity Tolerance Patient tolerated treatment well    Behavior During Therapy WFL for tasks assessed/performed                Past Medical History:  Diagnosis Date   Depression    High cholesterol    Hypertension    Restless leg    Past Surgical History:  Procedure Laterality Date   BUNIONECTOMY     CHOLECYSTECTOMY     Patient Active Problem List   Diagnosis Date Noted   Depression 03/21/2022   Essential hypertension 03/21/2022   Hyperlipidemia 03/21/2022   Restless leg syndrome 03/21/2022   Preoperative evaluation to rule out surgical contraindication 07/17/2021   Dermatochalasis of upper and lower eyelids of both eyes 02/23/2021   Diplopia 02/23/2021   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   Nuclear sclerotic cataract of both eyes 02/23/2021   Bilateral sensorineural hearing loss 09/09/2020   Orthostatic hypotension 07/15/2020   Grief 12/23/2019   Hypokalemia 12/23/2019   Osteopenia of spine 08/04/2019   Moderate episode of recurrent major depressive disorder ([facility]) 08/12/2018   Dental decay 03/19/2018   Closed displaced fracture of distal phalanx of left middle finger with routine healing 12/24/2016   Fracture of lumbar spine ([facility]) 04/11/2014   AK (actinic keratosis) 11/04/2012   Neuropathy 05/05/2012   Personal history of other malignant neoplasm  of skin 04/22/2012    REFERRING DIAG:  R29.6 (ICD-10-CM) - Repeated falls    THERAPY DIAG:  History of falling  Difficulty in walking, not elsewhere classified  Muscle weakness (generalized)  Imbalance  Rationale for Evaluation and Treatment Rehabilitation  PERTINENT HISTORY: Patient is a 77 year old female referred to outpatient PT for frequent falls following hospital discharge (pt was admitted 10/16/22 due to pneumonia and had positive urinalysis for infection). Patient currently reports that her condition has improved, but she still has not gotten her voice back. Pt denies chest pain or SOB with getting around. Pt reports difficulty with balance and fear of falling. She reports unsteadiness on her feet that is worse after prolonged sitting. Pt reports LOB with bending down e.g. doing gardening and trying to get up. She reports no major change since hospitalization with this. Pt reports neuropathy affecting her feet. Patient reports      Pain: No Numbness/Tingling: Yes, numbness in feet and paresthesias in feet/toes  Focal Weakness: No Recent changes in overall health/medication: Yes, hospitalization in November  Prior history of physical therapy for balance:  Yes, history of PT for balance in [facility]16  Falls: Has patient fallen in last 6 months? Yes, Number of falls: 3-4 times, on injurious falls  Directional pattern for falls: Yes, forward  Imaging: No  Prior level of function: Independent Occupational demands: N/A Hobbies: walking, gardening    Red flags (bowel/bladder  changes, saddle paresthesia, personal history of cancer, h/o spinal tumors, h/o compression fx, h/o abdominal aneurysm, abdominal pain, chills/fever, night sweats, nausea, vomiting, unrelenting pain): Positive for history of skin cancer only on patient's chart   Weight Bearing Restrictions: No   Living Environment Lives with: lives alone, her son lives nearby; daughter lives in [place]  Lives in:  House/apartment,  home is 2 levels. She has spiral staircase to get to 2nd floor with handrail on one side. Her bedroom/bathroom is downstairs.  2 steps to get into home at back entrance, handrail on both sides. Has following equipment at home: Single point cane, shower chair, Grab bars, and bars for toilet transfer      Patient Goals: "Strengthen my balance", more endurance     PRECAUTIONS: Fall history  SUBJECTIVE:                                                                                                                                                                                      SUBJECTIVE STATEMENT:  Pt reports no significant changes today. No recent falls or near-falls.      OBJECTIVE: (objective measures completed at initial evaluation unless otherwise dated)   Patient Surveys  FOTO: 57 ,predicted improvement to 70 ABC: 36.9%     GAIT: Distance walked: 150 Assistive device utilized: None Level of assistance: SBA Comments: Decreased arm swing and truncal rigidity, mild forward head and kyphotic posture maintained throughout gait cycle. Pt demonstrates decreased step length and increased cadence. Pt has sound heel strike bilaterally. No pelvic asymmetry or pelvic drop.      Posture: FHRS posture, increased thoracic kyphosis       LE MMT:   MMT (out of 5) Right 11/27/2022 Left 11/27/2022  Hip flexion 4- 4-  Hip extension      Hip abduction (seated) 4 4  Hip adduction (seated)  5 5  Hip internal rotation      Hip external rotation      Knee flexion 5 4+  Knee extension 4+ 5-  Ankle dorsiflexion 4+ 4+  Ankle plantarflexion      Ankle inversion      Ankle eversion      (* = pain; Blank rows = not tested)     Sensation Loss of light touch sensation in bilateral digits of feet      Reflexes Deferred     Cranial Nerves Deferred     Coordination/Cerebellar Finger to Nose: WNL Heel to Shin: WNL Rapid alternating movements: WNL Finger  Opposition: WNL Pronator Drift: Negative     FUNCTIONAL OUTCOME MEASURES     Results Comments  BERG 47/56 Fall risk, in  need of intervention  DGI 15/24    TUG 13.63 seconds Fall risk, in need of intervention    5TSTS 18 seconds Fall risk, in need of intervention    6 Minute Walk Test 980 ft    (Blank rows = not tested)       TODAY'S TREATMENT      Neuromuscular Re-education - for improved sensory integration, static and dynamic postural control, equilibrium and non-equilibrium coordination as needed for negotiating home and community environment and stepping over obstacles    (All exercises performed with careful CGA)   In parallel bars: Forward and retro stepping; 5x D/B High knees; 5x D/B  Toe tapping, 6-inch step; alternating R/L, 2x10  Hurdle step over 5-lb Dbell; 1x10 with each LE   -heavy cueing for heel to toe stepping; cued for decreased upper limb support  Consecutive hurdle forward step-over; (3) 6-inch hurdles; 4x D/B with step-to pattern  Standing feet together with perturbations; x 1 minutes  Stride stance with perturbations, RLE in back and LLE in back; x 1 minute each  Semitandem standing; 1x45 sec in each position (RLE in back and LLE in back)  *not today* Standing feet together on Airex; 2x30 sec Blue star 8-way multidirectional stepping; x 4 ea dir with bilat LE, intermittent cueing for technique   Therapeutic Exercise - improved strength as needed to improve performance of CKC activities/functional movements and as needed for power production to prevent fall during episode of large postural perturbation   NuStep; 5 minutes; Level 2, seat at 8, arms at 10   -subjective information gathered during this time  Sit to stand with Airex under feet; 2x10  3-way hip in standing; BUE support in parallel bars, with 4-lb ankle weights; x8 ea dir   PATIENT EDUCATION: HEP review       PATIENT EDUCATION:  Education details: see above for patient  education details   Person educated: Patient Education method: Explanation and Handouts Education comprehension: verbalized understanding     HOME EXERCISE PROGRAM: Access Code: [email or web address] URL: [email or web address] Date: 12/04/2022 Prepared by: Valentina Gu  Exercises - Sit to Stand with Counter Support  - 2 x daily - 7 x weekly - 2 sets - 10 reps - Heel Toe Raises with Counter Support  - 2 x daily - 7 x weekly - 2 sets - 10 reps - Standing March with Counter Support  - 2 x daily - 7 x weekly - 2 sets - 10 reps - Standing Hip Abduction with Unilateral Counter Support  - 2 x daily - 7 x weekly - 2 sets - 10 reps     ASSESSMENT:   CLINICAL IMPRESSION: Patient was able to appropriately manage perturbations in stride stance. Pt does still have difficulty with obstacle negotiation and intermittent dysequilibrium with mult-directional stepping requiring further PT intervention. Patient has remaining deficits in postural control, lower extremity strength, equilibrium lower limb coordination, and impaired proprioception/kinesthetic sense of lower limbs associated with neuropathy. Patient will benefit from continued skilled therapeutic intervention to address the above deficits as needed for improved function and QoL.     REHAB POTENTIAL: Good   CLINICAL DECISION MAKING: Evolving/moderate complexity   EVALUATION COMPLEXITY: High     GOALS: Goals reviewed with patient? No   SHORT TERM GOALS: Target date: 12/18/2022   Pt will be independent with HEP in order to improve strength and balance in order to decrease fall risk and improve function at home. Baseline: 11/27/22: Baseline HEP initiated Goal status: INITIAL  LONG TERM GOALS: Target date: 01/22/2023   Pt will increase FOTO to at least 70 to demonstrate significant improvement in function at home related to balance  Baseline: 11/27/22: 64 Goal status: INITIAL   2.  Pt will improve BERG by at least 3 points in  order to demonstrate clinically significant improvement in balance.   Baseline: 11/27/22: BERG 47/56.  Goal status: INITIAL   3.  Pt will improve ABC by at least 13% in order to demonstrate clinically significant improvement in balance confidence.      Baseline: 11/27/22: 36.9% Goal status: INITIAL   4. Pt will decrease 5TSTS by at least 3 seconds in order to demonstrate clinically significant improvement in LE strength      Baseline: 11/27/22: 18 sec Goal status: INITIAL   5. Pt will improve DGI by at least 3 points in order to demonstrate clinically significant improvement in balance and decreased risk for falls.     Baseline: 11/27/22: Deferred to visit # 2.      11/29/22: 15/24 Goal status: INITIAL   6. Pt will decrease TUG to below 12 seconds/decrease in order to demonstrate decreased fall risk.  Baseline: 11/27/22: 13.6 sec Goal status: INITIAL   7. Pt will increase by at least 49m (185ft) in order to demonstrate clinically significant improvement in cardiopulmonary endurance and community ambulation  Baseline: 11/27/22: Deferred to visit # 2.     11/29/22: 980 feet.   Goal status: INITIAL      PLAN: PT FREQUENCY: 2x/week   PT DURATION: 8 weeks   PLANNED INTERVENTIONS: Therapeutic exercises, Therapeutic activity, Neuromuscular re-education, Balance training, Gait training, Patient/Family education, Electrical stimulation   PLAN FOR NEXT SESSION: Continued with strategies for weight-shifting and static and dynamic balance drills. Complete drills encouraging longer steps and bending/squatting to improve stability with these specific activities. Train multi-directional stepping to improve equilibrium coordination.     Cora Collum, Student-PT 12/13/2022, 5:17 PM  Consuela Mimes, PT, DPT [phone]  This entire session was performed under direct supervision and direction of a licensed therapist/therapist assistant. I have personally read, edited and approve of the note as written.

## 2022-12-18 ENCOUNTER — Encounter: Payer: Self-pay | Admitting: Physical Therapy

## 2022-12-18 ENCOUNTER — Ambulatory Visit: Payer: Medicare HMO | Admitting: Physical Therapy

## 2022-12-18 DIAGNOSIS — Z9181 History of falling: Secondary | ICD-10-CM

## 2022-12-18 DIAGNOSIS — R262 Difficulty in walking, not elsewhere classified: Secondary | ICD-10-CM

## 2022-12-18 DIAGNOSIS — R2689 Other abnormalities of gait and mobility: Secondary | ICD-10-CM

## 2022-12-18 DIAGNOSIS — M6281 Muscle weakness (generalized): Secondary | ICD-10-CM

## 2022-12-18 NOTE — Therapy (Signed)
 " OUTPATIENT PHYSICAL THERAPY TREATMENT NOTE   Patient Name: Barbara Ballard MRN: [id number] DOB:11-19-1946, 77 y.o., female Today's Date: 12/18/22  PCP: [facility], [place]  REFERRING PROVIDER: Damien Ryder, PA-C   END OF SESSION:   PT End of Session - 12/18/22 1053     Visit Number 7    Number of Visits 17    Date for PT Re-Evaluation 01/22/23    Authorization Type [employer] 2024    Progress Note Due on Visit 10    PT Start Time 0945    PT Stop Time 1030    PT Time Calculation (min) 45 min    Equipment Utilized During Treatment Gait belt    Activity Tolerance Patient tolerated treatment well    Behavior During Therapy [facility] for tasks assessed/performed                 Past Medical History:  Diagnosis Date   Depression    High cholesterol    Hypertension    Restless leg    Past Surgical History:  Procedure Laterality Date   BUNIONECTOMY     CHOLECYSTECTOMY     Patient Active Problem List   Diagnosis Date Noted   Depression 03/21/2022   Essential hypertension 03/21/2022   Hyperlipidemia 03/21/2022   Restless leg syndrome 03/21/2022   Preoperative evaluation to rule out surgical contraindication 07/17/2021   Dermatochalasis of upper and lower eyelids of both eyes 02/23/2021   Diplopia 02/23/2021   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   Nuclear sclerotic cataract of both eyes 02/23/2021   Bilateral sensorineural hearing loss 09/09/2020   Orthostatic hypotension 07/15/2020   Grief 12/23/2019   Hypokalemia 12/23/2019   Osteopenia of spine 08/04/2019   Moderate episode of recurrent major depressive disorder (HCC) 08/12/2018   Dental decay 03/19/2018   Closed displaced fracture of distal phalanx of left middle finger with routine healing 12/24/2016   Fracture of lumbar spine (HCC) 04/11/2014   AK (actinic keratosis) 11/04/2012   Neuropathy 05/05/2012   Personal history of other malignant neoplasm  of skin 04/22/2012    REFERRING DIAG:  R29.6 (ICD-10-CM) - Repeated falls    THERAPY DIAG:  History of falling  Difficulty in walking, not elsewhere classified  Muscle weakness (generalized)  Imbalance  Rationale for Evaluation and Treatment Rehabilitation  PERTINENT HISTORY: Patient is a 77 year old female referred to outpatient PT for frequent falls following hospital discharge (pt was admitted 10/16/22 due to pneumonia and had positive urinalysis for infection). Patient currently reports that her condition has improved, but she still has not gotten her voice back. Pt denies chest pain or SOB with getting around. Pt reports difficulty with balance and fear of falling. She reports unsteadiness on her feet that is worse after prolonged sitting. Pt reports LOB with bending down e.g. doing gardening and trying to get up. She reports no major change since hospitalization with this. Pt reports neuropathy affecting her feet.      Pain: No Numbness/Tingling: Yes, numbness in feet and paresthesias in feet/toes  Focal Weakness: No Recent changes in overall health/medication: Yes, hospitalization in November  Prior history of physical therapy for balance:  Yes, history of PT for balance in 2916  Falls: Has patient fallen in last 6 months? Yes, Number of falls: 3-4 times, non-injurious falls  Directional pattern for falls: Yes, forward  Imaging: No  Prior level of function: Independent Occupational demands: N/A Hobbies: walking, gardening    Red flags (bowel/bladder changes,  saddle paresthesia, personal history of cancer, h/o spinal tumors, h/o compression fx, h/o abdominal aneurysm, abdominal pain, chills/fever, night sweats, nausea, vomiting, unrelenting pain): Positive for history of skin cancer only on patient's chart   Weight Bearing Restrictions: No   Living Environment Lives with: lives alone, her son lives nearby; daughter lives in [place]  Lives in: House/apartment,  home is 2  levels. She has spiral staircase to get to 2nd floor with handrail on one side. Her bedroom/bathroom is downstairs.  2 steps to get into home at back entrance, handrail on both sides. Has following equipment at home: Single point cane, shower chair, Grab bars, and bars for toilet transfer      Patient Goals: Strengthen my balance, more endurance     PRECAUTIONS: Fall history  SUBJECTIVE:                                                                                                                                                                                      SUBJECTIVE STATEMENT:  Pt reports no significant changes today. No recent falls or near-falls. Pt reported some soreness with HEP exercises but it does not continue to the next morning.     OBJECTIVE: (objective measures completed at initial evaluation unless otherwise dated)   Patient Surveys  FOTO: 6 ,predicted improvement to 70 ABC: 36.9%     GAIT: Distance walked: 150 Assistive device utilized: None Level of assistance: SBA Comments: Decreased arm swing and truncal rigidity, mild forward head and kyphotic posture maintained throughout gait cycle. Pt demonstrates decreased step length and increased cadence. Pt has sound heel strike bilaterally. No pelvic asymmetry or pelvic drop.      Posture: FHRS posture, increased thoracic kyphosis       LE MMT:   MMT (out of 5) Right 11/27/2022 Left 11/27/2022  Hip flexion 4- 4-  Hip extension      Hip abduction (seated) 4 4  Hip adduction (seated)  5 5  Hip internal rotation      Hip external rotation      Knee flexion 5 4+  Knee extension 4+ 5-  Ankle dorsiflexion 4+ 4+  Ankle plantarflexion      Ankle inversion      Ankle eversion      (* = pain; Blank rows = not tested)     Sensation Loss of light touch sensation in bilateral digits of feet      Reflexes Deferred     Cranial Nerves Deferred     Coordination/Cerebellar Finger to Nose: WNL Heel  to Shin: WNL Rapid alternating movements: WNL Finger Opposition: WNL Pronator Drift: Negative     FUNCTIONAL  OUTCOME MEASURES     Results Comments  BERG 47/56 Fall risk, in need of intervention  DGI 15/24    TUG 13.63 seconds Fall risk, in need of intervention    5TSTS 18 seconds Fall risk, in need of intervention    6 Minute Walk Test 980 ft    (Blank rows = not tested)       TODAY'S TREATMENT      Neuromuscular Re-education - for improved sensory integration, static and dynamic postural control, equilibrium and non-equilibrium coordination as needed for negotiating home and community environment and stepping over obstacles    (All exercises performed with careful CGA)   In parallel bars: Forward and retro stepping; 5x D/B   Hurdle step over 5-lb Dbell; 1x10 with each LE   -heavy cueing for heel to toe stepping; cued for decreased upper limb support   Standing feet together on Airex; 2x30 sec  Stride stance with perturbations, RLE in back and LLE in back; x 1 minute each  Semitandem standing; 2x45 sec in each position (RLE in back and LLE in back)  *not today* Blue star 8-way multidirectional stepping; x 4 ea dir with bilat LE, intermittent cueing for technique Standing feet together with perturbations; x 1 minutes  Consecutive hurdle forward step-over; (3) 6-inch hurdles; 4x D/B with step-to pattern Toe tapping, 6-inch step; alternating R/L, 2x10 High knees; 5x D/B  Therapeutic Exercise - improved strength as needed to improve performance of CKC activities/functional movements and as needed for power production to prevent fall during episode of large postural perturbation   NuStep; 5 minutes; Level 3, seat at 8, arms at 10   -subjective information gathered during this time  Sit to stand with Airex under feet; 2x10  3-way hip in standing; BUE support in parallel bars, with 4-lb ankle weights; 2x10 ea dir   PATIENT EDUCATION: HEP review      PATIENT  EDUCATION:  Education details: see above for patient education details   Person educated: Patient Education method: Explanation and Handouts Education comprehension: verbalized understanding     HOME EXERCISE PROGRAM: Access Code: [email or web address] URL: [email or web address] Date: 12/04/2022 Prepared by: Venetia Endo  Exercises - Sit to Stand with Counter Support  - 2 x daily - 7 x weekly - 2 sets - 10 reps - Heel Toe Raises with Counter Support  - 2 x daily - 7 x weekly - 2 sets - 10 reps - Standing March with Counter Support  - 2 x daily - 7 x weekly - 2 sets - 10 reps - Standing Hip Abduction with Unilateral Counter Support  - 2 x daily - 7 x weekly - 2 sets - 10 reps     ASSESSMENT:   CLINICAL IMPRESSION: Patient was able to maintain good balance on the Airex pad and pt was able to appropriately manage perturbations in stride stance. Pt was more fatigued today with an increase in repetitions over hurdles and 3-way hip in standing. Pt does still have difficulty with obstacle negotiation and intermittent dysequilibrium with multi-directional stepping requiring further PT intervention. Patient has remaining deficits in postural control, lower extremity strength, equilibrium lower limb coordination, and impaired proprioception/kinesthetic sense of lower limbs associated with neuropathy. Patient will benefit from continued skilled therapeutic intervention to address the above deficits as needed for improved function and QoL.     REHAB POTENTIAL: Good   CLINICAL DECISION MAKING: Evolving/moderate complexity   EVALUATION COMPLEXITY: High     GOALS: Goals reviewed with patient? No  SHORT TERM GOALS: Target date: 12/18/2022   Pt will be independent with HEP in order to improve strength and balance in order to decrease fall risk and improve function at home. Baseline: 11/27/22: Baseline HEP initiated Goal status: INITIAL     LONG TERM GOALS: Target date: 01/22/2023   Pt  will increase FOTO to at least 70 to demonstrate significant improvement in function at home related to balance  Baseline: 11/27/22: 64 Goal status: INITIAL   2.  Pt will improve BERG by at least 3 points in order to demonstrate clinically significant improvement in balance.   Baseline: 11/27/22: BERG 47/56.  Goal status: INITIAL   3.  Pt will improve ABC by at least 13% in order to demonstrate clinically significant improvement in balance confidence.      Baseline: 11/27/22: 36.9% Goal status: INITIAL   4. Pt will decrease 5TSTS by at least 3 seconds in order to demonstrate clinically significant improvement in LE strength      Baseline: 11/27/22: 18 sec Goal status: INITIAL   5. Pt will improve DGI by at least 3 points in order to demonstrate clinically significant improvement in balance and decreased risk for falls.     Baseline: 11/27/22: Deferred to visit # 2.      11/29/22: 15/24 Goal status: INITIAL   6. Pt will decrease TUG to below 12 seconds/decrease in order to demonstrate decreased fall risk.  Baseline: 11/27/22: 13.6 sec Goal status: INITIAL   7. Pt will increase by at least 76m (168ft) in order to demonstrate clinically significant improvement in cardiopulmonary endurance and community ambulation  Baseline: 11/27/22: Deferred to visit # 2.     11/29/22: 980 feet.   Goal status: INITIAL      PLAN: PT FREQUENCY: 2x/week   PT DURATION: 8 weeks   PLANNED INTERVENTIONS: Therapeutic exercises, Therapeutic activity, Neuromuscular re-education, Balance training, Gait training, Patient/Family education, Electrical stimulation   PLAN FOR NEXT SESSION: Continued with strategies for weight-shifting and static and dynamic balance drills. Complete drills encouraging longer steps and bending/squatting to improve stability with these specific activities. Train multi-directional stepping to improve equilibrium coordination by doing cone reaches for that forward and downward reaching while  maintaining balance.     Kahlan Engebretson, Student-PT 12/19/2022, 1:21 PM  Venetia Endo, PT, DPT [phone]  This entire session was performed under direct supervision and direction of a licensed therapist/therapist assistant. I have personally read, edited and approve of the note as written.   "

## 2022-12-20 ENCOUNTER — Encounter: Payer: Self-pay | Admitting: Physical Therapy

## 2022-12-20 ENCOUNTER — Ambulatory Visit: Payer: Medicare HMO | Admitting: Physical Therapy

## 2022-12-20 DIAGNOSIS — Z9181 History of falling: Secondary | ICD-10-CM | POA: Diagnosis not present

## 2022-12-20 DIAGNOSIS — M6281 Muscle weakness (generalized): Secondary | ICD-10-CM

## 2022-12-20 DIAGNOSIS — R262 Difficulty in walking, not elsewhere classified: Secondary | ICD-10-CM

## 2022-12-20 DIAGNOSIS — R2689 Other abnormalities of gait and mobility: Secondary | ICD-10-CM

## 2022-12-20 NOTE — Therapy (Signed)
OUTPATIENT PHYSICAL THERAPY TREATMENT NOTE   Patient Name: Barbara Ballard MRN: [id number] DOB:1946-07-15, 77 y.o., female Today's Date: 12/20/22  PCP: [facility] [place] PROVIDER: Hilton Sinclair, PA-C   END OF SESSION:   PT End of Session - 12/22/22 0954     Visit Number 8    Number of Visits 17    Date for PT Re-Evaluation 01/22/23    PT Start Time 0950    PT Stop Time 1030    PT Time Calculation (min) 40 min    Equipment Utilized During Treatment Gait belt    Activity Tolerance Patient tolerated treatment well    Behavior During Therapy [facility] for tasks assessed/performed                  Past Medical History:  Diagnosis Date   Depression    High cholesterol    Hypertension    Restless leg    Past Surgical History:  Procedure Laterality Date   BUNIONECTOMY     CHOLECYSTECTOMY     Patient Active Problem List   Diagnosis Date Noted   Depression 03/21/2022   Essential hypertension 03/21/2022   Hyperlipidemia 03/21/2022   Restless leg syndrome 03/21/2022   Preoperative evaluation to rule out surgical contraindication 07/17/2021   Dermatochalasis of upper and lower eyelids of both eyes 02/23/2021   Diplopia 02/23/2021   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   Nuclear sclerotic cataract of both eyes 02/23/2021   Bilateral sensorineural hearing loss 09/09/2020   Orthostatic hypotension 07/15/2020   Grief 12/23/2019   Hypokalemia 12/23/2019   Osteopenia of spine 08/04/2019   Moderate episode of recurrent major depressive disorder ([place]) 08/12/2018   Dental decay 03/19/2018   Closed displaced fracture of distal phalanx of left middle finger with routine healing 12/24/2016   Fracture of lumbar spine ([place]) 04/11/2014   AK (actinic keratosis) 11/04/2012   Neuropathy 05/05/2012   Personal history of other malignant neoplasm of skin 04/22/2012    REFERRING DIAG:  R29.6 (ICD-10-CM) - Repeated  falls    THERAPY DIAG:  History of falling  Difficulty in walking, not elsewhere classified  Muscle weakness (generalized)  Imbalance  Rationale for Evaluation and Treatment Rehabilitation  PERTINENT HISTORY: Patient is a 77 year old female referred to outpatient PT for frequent falls following hospital discharge (pt was admitted 10/16/22 due to pneumonia and had positive urinalysis for infection). Patient currently reports that her condition has improved, but she still has not gotten her voice back. Pt denies chest pain or SOB with getting around. Pt reports difficulty with balance and fear of falling. She reports unsteadiness on her feet that is worse after prolonged sitting. Pt reports LOB with bending down e.g. doing gardening and trying to get up. She reports no major change since hospitalization with this. Pt reports neuropathy affecting her feet.      Pain: No Numbness/Tingling: Yes, numbness in feet and paresthesias in feet/toes  Focal Weakness: No Recent changes in overall health/medication: Yes, hospitalization in November  Prior history of physical therapy for balance:  Yes, history of PT for balance in 2916  Falls: Has patient fallen in last 6 months? Yes, Number of falls: 3-4 times, non-injurious falls  Directional pattern for falls: Yes, forward  Imaging: No  Prior level of function: Independent Occupational demands: N/A Hobbies: walking, gardening    Red flags (bowel/bladder changes, saddle paresthesia, personal history of cancer, h/o spinal tumors, h/o compression fx, h/o abdominal aneurysm, abdominal pain,  chills/fever, night sweats, nausea, vomiting, unrelenting pain): Positive for history of skin cancer only on patient's chart   Weight Bearing Restrictions: No   Living Environment Lives with: lives alone, her son lives nearby; daughter lives in [place]  Lives in: House/apartment,  home is 2 levels. She has spiral staircase to get to 2nd floor with handrail on  one side. Her bedroom/bathroom is downstairs.  2 steps to get into home at back entrance, handrail on both sides. Has following equipment at home: Single point cane, shower chair, Grab bars, and bars for toilet transfer      Patient Goals: "Strengthen my balance", more endurance     PRECAUTIONS: Fall history  SUBJECTIVE:                                                                                                                                                                                      SUBJECTIVE STATEMENT:  Patient states that she has been practicing her gait with proper heel-to-toe walking. Pt reports no significant changes today. No recent falls or near-falls.    OBJECTIVE: (objective measures completed at initial evaluation unless otherwise dated)   Patient Surveys  FOTO: 5 ,predicted improvement to 70 ABC: 36.9%     GAIT: Distance walked: 150 Assistive device utilized: None Level of assistance: SBA Comments: Decreased arm swing and truncal rigidity, mild forward head and kyphotic posture maintained throughout gait cycle. Pt demonstrates decreased step length and increased cadence. Pt has sound heel strike bilaterally. No pelvic asymmetry or pelvic drop.      Posture: FHRS posture, increased thoracic kyphosis       LE MMT:   MMT (out of 5) Right 11/27/2022 Left 11/27/2022  Hip flexion 4- 4-  Hip extension      Hip abduction (seated) 4 4  Hip adduction (seated)  5 5  Hip internal rotation      Hip external rotation      Knee flexion 5 4+  Knee extension 4+ 5-  Ankle dorsiflexion 4+ 4+  Ankle plantarflexion      Ankle inversion      Ankle eversion      (* = pain; Blank rows = not tested)     Sensation Loss of light touch sensation in bilateral digits of feet      Reflexes Deferred     Cranial Nerves Deferred     Coordination/Cerebellar Finger to Nose: WNL Heel to Shin: WNL Rapid alternating movements: WNL Finger Opposition:  WNL Pronator Drift: Negative     FUNCTIONAL OUTCOME MEASURES     Results Comments  BERG 47/56 Fall risk, in need of intervention  DGI 15/24  TUG 13.63 seconds Fall risk, in need of intervention    5TSTS 18 seconds Fall risk, in need of intervention    6 Minute Walk Test 980 ft    (Blank rows = not tested)       TODAY'S TREATMENT      Neuromuscular Re-education - for improved sensory integration, static and dynamic postural control, equilibrium and non-equilibrium coordination as needed for negotiating home and community environment and stepping over obstacles    (All exercises performed with careful CGA)   In parallel bars: Forward and retro stepping; 5x D/B   Hurdle step over 5-lb Dbell; 2x10 with each LE   -heavy cueing for heel to toe stepping; cued for decreased upper limb support   Consecutive hurdle forward step-over; (3) 6-inch hurdles; 2x5 D/B with step-to pattern  Blue star 8-way multidirectional stepping; x 4 ea dir with bilat LE, intermittent cueing for technique   Cone reaching, pt reaching outside of BOS to challenge static postural control; 3x7 cones with L and R hand  *not today* Semitandem standing; 2x45 sec in each position (RLE in back and LLE in back) Standing feet together with perturbations; x 1 minutes  Toe tapping, 6-inch step; alternating R/L, 2x10 High knees; 5x D/B Standing feet together on Airex; 2x30 sec Stride stance with perturbations, RLE in back and LLE in back; x 1 minute each  Therapeutic Exercise - improved strength as needed to improve performance of CKC activities/functional movements and as needed for power production to prevent fall during episode of large postural perturbation   NuStep; 5 minutes; Level 3, seat at 8, arms at 10   -subjective information gathered during this time   3-way hip in standing; BUE support in parallel bars, with 4-lb ankle weights; 2x10 ea dir    *not today* Sit to stand with Airex under feet;  2x10  PATIENT EDUCATION: HEP review      PATIENT EDUCATION:  Education details: see above for patient education details Person educated: Patient Education method: Explanation and Handouts Education comprehension: verbalized understanding     HOME EXERCISE PROGRAM: Access Code: [email or web address] URL: [email or web address] Date: 12/04/2022 Prepared by: Valentina Gu  Exercises - Sit to Stand with Counter Support  - 2 x daily - 7 x weekly - 2 sets - 10 reps - Heel Toe Raises with Counter Support  - 2 x daily - 7 x weekly - 2 sets - 10 reps - Standing March with Counter Support  - 2 x daily - 7 x weekly - 2 sets - 10 reps - Standing Hip Abduction with Unilateral Counter Support  - 2 x daily - 7 x weekly - 2 sets - 10 reps     ASSESSMENT:   CLINICAL IMPRESSION: Patient was more fatigued today with an increase in repetitions over consecutive hurdles and 3-way hip in standing. Pt does still have difficulty with obstacle negotiation and intermittent dysequilibrium with multi-directional stepping requiring further PT intervention. Patient has remaining deficits in postural control, lower extremity strength, equilibrium lower limb coordination, and impaired proprioception/kinesthetic sense of lower limbs associated with neuropathy. Patient will benefit from continued skilled therapeutic intervention to address the above deficits as needed for improved function and QoL.     REHAB POTENTIAL: Good   CLINICAL DECISION MAKING: Evolving/moderate complexity   EVALUATION COMPLEXITY: High     GOALS: Goals reviewed with patient? No   SHORT TERM GOALS: Target date: 12/18/2022   Pt will be independent with HEP in order to improve strength and  balance in order to decrease fall risk and improve function at home. Baseline: 11/27/22: Baseline HEP initiated Goal status: INITIAL     LONG TERM GOALS: Target date: 01/22/2023   Pt will increase FOTO to at least 70 to demonstrate significant  improvement in function at home related to balance  Baseline: 11/27/22: 64 Goal status: INITIAL   2.  Pt will improve BERG by at least 3 points in order to demonstrate clinically significant improvement in balance.   Baseline: 11/27/22: BERG 47/56.  Goal status: INITIAL   3.  Pt will improve ABC by at least 13% in order to demonstrate clinically significant improvement in balance confidence.      Baseline: 11/27/22: 36.9% Goal status: INITIAL   4. Pt will decrease 5TSTS by at least 3 seconds in order to demonstrate clinically significant improvement in LE strength      Baseline: 11/27/22: 18 sec Goal status: INITIAL   5. Pt will improve DGI by at least 3 points in order to demonstrate clinically significant improvement in balance and decreased risk for falls.     Baseline: 11/27/22: Deferred to visit # 2.      11/29/22: 15/24 Goal status: INITIAL   6. Pt will decrease TUG to below 12 seconds/decrease in order to demonstrate decreased fall risk.  Baseline: 11/27/22: 13.6 sec Goal status: INITIAL   7. Pt will increase by at least 70m (189ft) in order to demonstrate clinically significant improvement in cardiopulmonary endurance and community ambulation  Baseline: 11/27/22: Deferred to visit # 2.     11/29/22: 980 feet.   Goal status: INITIAL      PLAN: PT FREQUENCY: 2x/week   PT DURATION: 8 weeks   PLANNED INTERVENTIONS: Therapeutic exercises, Therapeutic activity, Neuromuscular re-education, Balance training, Gait training, Patient/Family education, Electrical stimulation   PLAN FOR NEXT SESSION: Continued with strategies for weight-shifting and static and dynamic balance drills. Complete drills encouraging longer steps and bending/squatting to improve stability with these specific activities. Train multi-directional stepping to improve equilibrium coordination by doing cone reaches for that forward and downward reaching while maintaining balance.     Cora Collum, Student-PT 12/20/2022,  10:57 AM  Consuela Mimes, PT, DPT [phone]

## 2022-12-25 ENCOUNTER — Encounter: Payer: Self-pay | Admitting: Physical Therapy

## 2022-12-25 ENCOUNTER — Ambulatory Visit: Payer: Medicare HMO | Admitting: Physical Therapy

## 2022-12-25 DIAGNOSIS — M6281 Muscle weakness (generalized): Secondary | ICD-10-CM

## 2022-12-25 DIAGNOSIS — Z9181 History of falling: Secondary | ICD-10-CM | POA: Diagnosis not present

## 2022-12-25 DIAGNOSIS — R262 Difficulty in walking, not elsewhere classified: Secondary | ICD-10-CM

## 2022-12-25 DIAGNOSIS — R2689 Other abnormalities of gait and mobility: Secondary | ICD-10-CM

## 2022-12-25 NOTE — Therapy (Signed)
OUTPATIENT PHYSICAL THERAPY TREATMENT NOTE   Patient Name: Barbara Ballard MRN: [id number] DOB:07-26-1946, 77 y.o., female Today's Date: 12/25/22  PCP: [facility], [place] PROVIDER: Hilton Sinclair, PA-C   END OF SESSION:   PT End of Session - 12/25/22 1047     Visit Number 9    Number of Visits 17    Date for PT Re-Evaluation 01/22/23    PT Start Time 0945    PT Stop Time 1030    PT Time Calculation (min) 45 min    Equipment Utilized During Treatment Gait belt    Activity Tolerance Patient tolerated treatment well    Behavior During Therapy [facility] for tasks assessed/performed                   Past Medical History:  Diagnosis Date   Depression    High cholesterol    Hypertension    Restless leg    Past Surgical History:  Procedure Laterality Date   BUNIONECTOMY     CHOLECYSTECTOMY     Patient Active Problem List   Diagnosis Date Noted   Depression 03/21/2022   Essential hypertension 03/21/2022   Hyperlipidemia 03/21/2022   Restless leg syndrome 03/21/2022   Preoperative evaluation to rule out surgical contraindication 07/17/2021   Dermatochalasis of upper and lower eyelids of both eyes 02/23/2021   Diplopia 02/23/2021   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   Nuclear sclerotic cataract of both eyes 02/23/2021   Bilateral sensorineural hearing loss 09/09/2020   Orthostatic hypotension 07/15/2020   Grief 12/23/2019   Hypokalemia 12/23/2019   Osteopenia of spine 08/04/2019   Moderate episode of recurrent major depressive disorder ([place]) 08/12/2018   Dental decay 03/19/2018   Closed displaced fracture of distal phalanx of left middle finger with routine healing 12/24/2016   Fracture of lumbar spine ([place]) 04/11/2014   AK (actinic keratosis) 11/04/2012   Neuropathy 05/05/2012   Personal history of other malignant neoplasm of skin 04/22/2012    REFERRING DIAG:  R29.6 (ICD-10-CM) - Repeated  falls    THERAPY DIAG:  History of falling  Difficulty in walking, not elsewhere classified  Muscle weakness (generalized)  Imbalance  Rationale for Evaluation and Treatment Rehabilitation  PERTINENT HISTORY: Patient is a 78 year old female referred to outpatient PT for frequent falls following hospital discharge (pt was admitted 10/16/22 due to pneumonia and had positive urinalysis for infection). Patient currently reports that her condition has improved, but she still has not gotten her voice back. Pt denies chest pain or SOB with getting around. Pt reports difficulty with balance and fear of falling. She reports unsteadiness on her feet that is worse after prolonged sitting. Pt reports LOB with bending down e.g. doing gardening and trying to get up. She reports no major change since hospitalization with this. Pt reports neuropathy affecting her feet.      Pain: No Numbness/Tingling: Yes, numbness in feet and paresthesias in feet/toes  Focal Weakness: No Recent changes in overall health/medication: Yes, hospitalization in November  Prior history of physical therapy for balance:  Yes, history of PT for balance in 2916  Falls: Has patient fallen in last 6 months? Yes, Number of falls: 3-4 times, non-injurious falls  Directional pattern for falls: Yes, forward  Imaging: No  Prior level of function: Independent Occupational demands: N/A Hobbies: walking, gardening    Red flags (bowel/bladder changes, saddle paresthesia, personal history of cancer, h/o spinal tumors, h/o compression fx, h/o abdominal aneurysm, abdominal  pain, chills/fever, night sweats, nausea, vomiting, unrelenting pain): Positive for history of skin cancer only on patient's chart   Weight Bearing Restrictions: No   Living Environment Lives with: lives alone, her son lives nearby; daughter lives in [place]  Lives in: House/apartment,  home is 2 levels. She has spiral staircase to get to 2nd floor with handrail on  one side. Her bedroom/bathroom is downstairs.  2 steps to get into home at back entrance, handrail on both sides. Has following equipment at home: Single point cane, shower chair, Grab bars, and bars for toilet transfer      Patient Goals: "Strengthen my balance", more endurance     PRECAUTIONS: Fall history  SUBJECTIVE:                                                                                                                                                                                      SUBJECTIVE STATEMENT:  Patient reports no major changes since last visit and no recent fall or stumbles. Pt states that they continue to perform HEP.    OBJECTIVE: (objective measures completed at initial evaluation unless otherwise dated)   Patient Surveys  FOTO: 54 ,predicted improvement to 70 ABC: 36.9%     GAIT: Distance walked: 150 Assistive device utilized: None Level of assistance: SBA Comments: Decreased arm swing and truncal rigidity, mild forward head and kyphotic posture maintained throughout gait cycle. Pt demonstrates decreased step length and increased cadence. Pt has sound heel strike bilaterally. No pelvic asymmetry or pelvic drop.      Posture: FHRS posture, increased thoracic kyphosis       LE MMT:   MMT (out of 5) Right 11/27/2022 Left 11/27/2022  Hip flexion 4- 4-  Hip extension      Hip abduction (seated) 4 4  Hip adduction (seated)  5 5  Hip internal rotation      Hip external rotation      Knee flexion 5 4+  Knee extension 4+ 5-  Ankle dorsiflexion 4+ 4+  Ankle plantarflexion      Ankle inversion      Ankle eversion      (* = pain; Blank rows = not tested)     Sensation Loss of light touch sensation in bilateral digits of feet      Reflexes Deferred     Cranial Nerves Deferred     Coordination/Cerebellar Finger to Nose: WNL Heel to Shin: WNL Rapid alternating movements: WNL Finger Opposition: WNL Pronator Drift: Negative      FUNCTIONAL OUTCOME MEASURES     Results Comments  BERG 47/56 Fall risk, in need of intervention  DGI 15/24  TUG 13.63 seconds Fall risk, in need of intervention    5TSTS 18 seconds Fall risk, in need of intervention    6 Minute Walk Test 980 ft    (Blank rows = not tested)       TODAY'S TREATMENT      Neuromuscular Re-education - for improved sensory integration, static and dynamic postural control, equilibrium and non-equilibrium coordination as needed for negotiating home and community environment and stepping over obstacles    (All exercises performed with careful CGA)   In parallel bars: Forward and retro stepping; 5x D/B   Blue star 8-way multidirectional stepping; 3x8  ea dir with bilat LE, intermittent cueing for technique  Consecutive hurdle forward step-over; (3) 6-inch hurdles; 2x5 D/B with step-to pattern   Cone reaching, pt reaching outside of BOS to challenge static postural control; 3x7 cones with L and R hand   *not today* Hurdle step over 5-lb Dbell; 2x10 with each LE  -heavy cueing for heel to toe stepping; cued for decreased upper limb support Semi-tandem standing; 2x45 sec in each position (RLE in back and LLE in back) Standing feet together with perturbations; x 1 minutes  Toe tapping, 6-inch step; alternating R/L, 2x10 High knees; 5x D/B Standing feet together on Airex; 2x30 sec Stride stance with perturbations, RLE in back and LLE in back; x 1 minute each  Therapeutic Exercise - improved strength as needed to improve performance of CKC activities/functional movements and as needed for power production to prevent fall during episode of large postural perturbation   NuStep; 5 minutes; Level 3, seat at 8, arms at 10   -subjective information gathered during this time   3-way hip in standing; BUE support in parallel bars, with 4-lb ankle weights; 1x8 each direction bilaterally    *not today* Sit to stand with Airex under feet;  2x10     PATIENT EDUCATION:  Education details: HEP review Person educated: Patient Education method: Explanation and Handouts Education comprehension: verbalized understanding     HOME EXERCISE PROGRAM: Access Code: [email or web address] URL: [email or web address] Date: 12/04/2022 Prepared by: Valentina Gu  Exercises - Sit to Stand with Counter Support  - 2 x daily - 7 x weekly - 2 sets - 10 reps - Heel Toe Raises with Counter Support  - 2 x daily - 7 x weekly - 2 sets - 10 reps - Standing March with Counter Support  - 2 x daily - 7 x weekly - 2 sets - 10 reps - Standing Hip Abduction with Unilateral Counter Support  - 2 x daily - 7 x weekly - 2 sets - 10 reps     ASSESSMENT:   CLINICAL IMPRESSION: Patient continues to have difficulty with obstacle negotiation and intermittent dysequilibrium with multi-directional stepping and increased difficulty with narrowed BOS in forward and backward stepping positions requiring further PT intervention. Pt required moderate cueing on maintaining balance between 6-inch consecutive hurdles. Pt has remaining deficits in postural control, lower extremity strength, equilibrium lower limb coordination, and impaired proprioception/kinesthetic sense of lower limbs associated with neuropathy. Patient will benefit from continued skilled therapeutic intervention to address the above deficits as needed for improved function and QoL.     REHAB POTENTIAL: Good   CLINICAL DECISION MAKING: Evolving/moderate complexity   EVALUATION COMPLEXITY: High     GOALS: Goals reviewed with patient? No   SHORT TERM GOALS: Target date: 12/18/2022   Pt will be independent with HEP in order to improve strength and balance in order to decrease fall  risk and improve function at home. Baseline: 11/27/22: Baseline HEP initiated Goal status: INITIAL     LONG TERM GOALS: Target date: 01/22/2023   Pt will increase FOTO to at least 70 to demonstrate significant  improvement in function at home related to balance  Baseline: 11/27/22: 64 Goal status: INITIAL   2.  Pt will improve BERG by at least 3 points in order to demonstrate clinically significant improvement in balance.   Baseline: 11/27/22: BERG 47/56.  Goal status: INITIAL   3.  Pt will improve ABC by at least 13% in order to demonstrate clinically significant improvement in balance confidence.      Baseline: 11/27/22: 36.9% Goal status: INITIAL   4. Pt will decrease 5TSTS by at least 3 seconds in order to demonstrate clinically significant improvement in LE strength      Baseline: 11/27/22: 18 sec Goal status: INITIAL   5. Pt will improve DGI by at least 3 points in order to demonstrate clinically significant improvement in balance and decreased risk for falls.     Baseline: 11/27/22: Deferred to visit # 2.      11/29/22: 15/24 Goal status: INITIAL   6. Pt will decrease TUG to below 12 seconds/decrease in order to demonstrate decreased fall risk.  Baseline: 11/27/22: 13.6 sec Goal status: INITIAL   7. Pt will increase by at least 23m (127ft) in order to demonstrate clinically significant improvement in cardiopulmonary endurance and community ambulation  Baseline: 11/27/22: Deferred to visit # 2.     11/29/22: 980 feet.   Goal status: INITIAL      PLAN: PT FREQUENCY: 2x/week   PT DURATION: 8 weeks   PLANNED INTERVENTIONS: Therapeutic exercises, Therapeutic activity, Neuromuscular re-education, Balance training, Gait training, Patient/Family education, Electrical stimulation   PLAN FOR NEXT SESSION: Continued with strategies for weight-shifting and static and dynamic balance drills. Complete drills encouraging longer steps and bending/squatting to improve stability with these specific activities. Train multi-directional stepping to improve equilibrium coordination by doing cone reaches for that forward and downward reaching while maintaining balance.     Cora Collum, Student-PT 12/25/2022  12:48 PM   Consuela Mimes, PT, DPT [phone]

## 2022-12-26 NOTE — Therapy (Addendum)
OUTPATIENT PHYSICAL THERAPY TREATMENT AND PROGRESS NOTE   Dates of reporting period  11/27/22   to   12/27/22     Patient Name: Barbara Ballard MRN: [id number] DOB:[birth year], 77 y.o., female Today's Date: 12/27/22  PCP: [facility] [place]  REFERRING PROVIDER: Hilton Sinclair, PA-C   END OF SESSION:   PT End of Session - 12/31/22 0800     Visit Number 10    Number of Visits 17    Date for PT Re-Evaluation 01/22/23    PT Start Time 0952    PT Stop Time 1039    PT Time Calculation (min) 47 min    Equipment Utilized During Treatment Gait belt    Activity Tolerance Patient tolerated treatment well    Behavior During Therapy [facility] for tasks assessed/performed                Past Medical History:  Diagnosis Date   Depression    High cholesterol    Hypertension    Restless leg    Past Surgical History:  Procedure Laterality Date   BUNIONECTOMY     CHOLECYSTECTOMY     Patient Active Problem List   Diagnosis Date Noted   Depression 03/21/2022   Essential hypertension 03/21/2022   Hyperlipidemia 03/21/2022   Restless leg syndrome 03/21/2022   Preoperative evaluation to rule out surgical contraindication 07/17/2021   Dermatochalasis of upper and lower eyelids of both eyes 02/23/2021   Diplopia 02/23/2021   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   Nuclear sclerotic cataract of both eyes 02/23/2021   Bilateral sensorineural hearing loss 09/09/2020   Orthostatic hypotension 07/15/2020   Grief 12/23/2019   Hypokalemia 12/23/2019   Osteopenia of spine 08/04/2019   Moderate episode of recurrent major depressive disorder ([place]) 08/12/2018   Dental decay 03/19/2018   Closed displaced fracture of distal phalanx of left middle finger with routine healing 12/24/2016   Fracture of lumbar spine ([place]) 04/11/2014   AK (actinic keratosis) 11/04/2012   Neuropathy 05/05/2012   Personal history of other malignant neoplasm of skin  04/22/2012    REFERRING DIAG:  R29.6 (ICD-10-CM) - Repeated falls    THERAPY DIAG:  History of falling  Difficulty in walking, not elsewhere classified  Muscle weakness (generalized)  Imbalance  Rationale for Evaluation and Treatment Rehabilitation  PERTINENT HISTORY: Patient is a 77 year old female referred to outpatient PT for frequent falls following hospital discharge (pt was admitted 10/16/22 due to pneumonia and had positive urinalysis for infection). Patient currently reports that her condition has improved, but she still has not gotten her voice back. Pt denies chest pain or SOB with getting around. Pt reports difficulty with balance and fear of falling. She reports unsteadiness on her feet that is worse after prolonged sitting. Pt reports LOB with bending down e.g. doing gardening and trying to get up. She reports no major change since hospitalization with this. Pt reports neuropathy affecting her feet.      Pain: No Numbness/Tingling: Yes, numbness in feet and paresthesias in feet/toes  Focal Weakness: No Recent changes in overall health/medication: Yes, hospitalization in November  Prior history of physical therapy for balance:  Yes, history of PT for balance in 2916  Falls: Has patient fallen in last 6 months? Yes, Number of falls: 3-4 times, non-injurious falls  Directional pattern for falls: Yes, forward  Imaging: No  Prior level of function: Independent Occupational demands: N/A Hobbies: walking, gardening    Red flags (bowel/bladder changes,  saddle paresthesia, personal history of cancer, h/o spinal tumors, h/o compression fx, h/o abdominal aneurysm, abdominal pain, chills/fever, night sweats, nausea, vomiting, unrelenting pain): Positive for history of skin cancer only on patient's chart   Weight Bearing Restrictions: No   Living Environment Lives with: lives alone, her son lives nearby; daughter lives in [place]  Lives in: House/apartment,  home is 2 levels.  She has spiral staircase to get to 2nd floor with handrail on one side. Her bedroom/bathroom is downstairs.  2 steps to get into home at back entrance, handrail on both sides. Has following equipment at home: Single point cane, shower chair, Grab bars, and bars for toilet transfer      Patient Goals: "Strengthen my balance", more endurance     PRECAUTIONS: Fall history  SUBJECTIVE:                                                                                                                                                                                      SUBJECTIVE STATEMENT:  Patient states that she is still hesitant with her porch steps in the backyard. She states that she feels about 45% better. She states that she would like to start walking again but does not know if she should bring her cane or anything else. Patient reports no major changes since last visit and no recent fall or stumbles. Pt states that they continue to perform HEP.    OBJECTIVE: (objective measures completed at initial evaluation unless otherwise dated)   Patient Surveys  FOTO: 3 ,predicted improvement to 70 ABC: 36.9%     GAIT: Distance walked: 150 Assistive device utilized: None Level of assistance: SBA Comments: Decreased arm swing and truncal rigidity, mild forward head and kyphotic posture maintained throughout gait cycle. Pt demonstrates decreased step length and increased cadence. Pt has sound heel strike bilaterally. No pelvic asymmetry or pelvic drop.      Posture: FHRS posture, increased thoracic kyphosis       LE MMT:   MMT (out of 5) Right 11/27/2022 Left 11/27/2022  Hip flexion 4- 4-  Hip extension      Hip abduction (seated) 4 4  Hip adduction (seated)  5 5  Hip internal rotation      Hip external rotation      Knee flexion 5 4+  Knee extension 4+ 5-  Ankle dorsiflexion 4+ 4+  Ankle plantarflexion      Ankle inversion      Ankle eversion      (* = pain; Blank rows = not  tested)     Sensation Loss of light touch sensation in bilateral digits of feet  Reflexes Deferred     Cranial Nerves Deferred     Coordination/Cerebellar Finger to Nose: WNL Heel to Shin: WNL Rapid alternating movements: WNL Finger Opposition: WNL Pronator Drift: Negative     FUNCTIONAL OUTCOME MEASURES     Results Comments  BERG 47/56 Fall risk, in need of intervention  DGI 15/24    TUG 13.63 seconds Fall risk, in need of intervention    5TSTS 18 seconds Fall risk, in need of intervention    6 Minute Walk Test 980 ft    (Blank rows = not tested)       TODAY'S TREATMENT      Neuromuscular Re-education - for improved sensory integration, static and dynamic postural control, equilibrium and non-equilibrium coordination as needed for negotiating home and community environment and stepping over obstacles (All exercises performed with careful CGA)  - Performance of DGI - Performance of BERG - Performance of TUG - Performance of 5TSTS   -Discussed results of functional outcome measures and association with fall risk. Discussed current PT progress.    *not today*  In parallel bars: Forward and retro stepping; 5x D/B Blue star 8-way multidirectional stepping; 3x8  ea dir with bilat LE, intermittent cueing for technique Consecutive hurdle forward step-over; (3) 6-inch hurdles; 2x5 D/B with step-to pattern Cone reaching, pt reaching outside of BOS to challenge static postural control; 3x7 cones with L and R hand Hurdle step over 5-lb Dbell; 2x10 with each LE  -heavy cueing for heel to toe stepping; cued for decreased upper limb support Semi-tandem standing; 2x45 sec in each position (RLE in back and LLE in back) Standing feet together with perturbations; x 1 minutes  Toe tapping, 6-inch step; alternating R/L, 2x10 High knees; 5x D/B Standing feet together on Airex; 2x30 sec Stride stance with perturbations, RLE in back and LLE in back; x 1 minute  each  Therapeutic Exercise - improved strength as needed to improve performance of CKC activities/functional movements and as needed for power production to prevent fall during episode of large postural perturbation   NuStep; 5 minutes; Level 3, seat at 8, arms at 10   -subjective information gathered during this time      *not today* Sit to stand with Airex under feet; 2x10  3-way hip in standing; BUE support in parallel bars, with 4-lb ankle weights; 1x8 each direction bilaterally    PATIENT EDUCATION:  Education details: HEP review Person educated: Patient Education method: Explanation and Handouts Education comprehension: verbalized understanding     HOME EXERCISE PROGRAM: Access Code: [email or web address] URL: [email or web address] Date: 12/04/2022 Prepared by: Valentina Gu  Exercises - Sit to Stand with Counter Support  - 2 x daily - 7 x weekly - 2 sets - 10 reps - Heel Toe Raises with Counter Support  - 2 x daily - 7 x weekly - 2 sets - 10 reps - Standing March with Counter Support  - 2 x daily - 7 x weekly - 2 sets - 10 reps - Standing Hip Abduction with Unilateral Counter Support  - 2 x daily - 7 x weekly - 2 sets - 10 reps     ASSESSMENT:   CLINICAL IMPRESSION: Patient demonstrated an improvement in TUG performance and has met the cutoff score to demonstrate a decrease in fall risk. Pt's objective information from BERG, DGI, and 5TSTS showed an insignificant change. Pt's FOTO score had a decrease in score and may be due to day-to-day perception of oneself. ABC Scale is improved, but pt has  not yet met MCID. Patient does need to demonstrate further progress in aforementioned outcome measures with next goal update to demonstrate benefit from PT given mixed results obtained today. Pt has remaining deficits in postural control, lower extremity strength, equilibrium lower limb coordination, and impaired proprioception/kinesthetic sense of lower limbs associated with  neuropathy. Patient will benefit from continued skilled therapeutic intervention to address the above deficits as needed for improved function and QoL.     REHAB POTENTIAL: Good   CLINICAL DECISION MAKING: Evolving/moderate complexity   EVALUATION COMPLEXITY: High     GOALS: Goals reviewed with patient? No   SHORT TERM GOALS: Target date: 12/18/2022   Pt will be independent with HEP in order to improve strength and balance in order to decrease fall risk and improve function at home. Baseline: 11/27/22: Baseline HEP initiated Goal status: ACHIEVED     LONG TERM GOALS: Target date: 01/22/2023   Pt will increase FOTO to at least 70 to demonstrate significant improvement in function at home related to balance  Baseline: 11/27/22: 64 12/27/22: 50 Goal status: NOT MET   2.  Pt will improve BERG by at least 3 points in order to demonstrate clinically significant improvement in balance.   Baseline: 11/27/22: BERG 47/56. 12/27/22: BERG 47/56  Goal status: ON GOING   3.  Pt will improve ABC by at least 13% in order to demonstrate clinically significant improvement in balance confidence.      Baseline: 11/27/22: 36.9%  12/27/22 : 45.6% Goal status: IN PROGRESS   4. Pt will decrease 5TSTS by at least 3 seconds in order to demonstrate clinically significant improvement in LE strength      Baseline: 11/27/22: 18 sec  12/27/22: 17.12 sec Goal status: ON GOING   5. Pt will improve DGI by at least 3 points in order to demonstrate clinically significant improvement in balance and decreased risk for falls.     Baseline: 11/27/22: Deferred to visit # 2.      11/29/22: 15/24     12/27/22:12/24 Goal status: NOT MET   6. Pt will decrease TUG to below 12 seconds/decrease in order to demonstrate decreased fall risk.  Baseline: 11/27/22: 13.6 sec     12/27/22: 11.5 sec Goal status: ACHIEVED   7. Pt will increase by at least 20m (13ft) in order to demonstrate clinically significant improvement in cardiopulmonary  endurance and community ambulation  Baseline: 11/27/22: Deferred to visit # 2.   11/29/22: 980 feet.  12/27/22:Deferred Goal status: DEFERRED      PLAN: PT FREQUENCY: 2x/week   PT DURATION: 8 weeks   PLANNED INTERVENTIONS: Therapeutic exercises, Therapeutic activity, Neuromuscular re-education, Balance training, Gait training, Patient/Family education, Electrical stimulation   PLAN FOR NEXT SESSION: Continued with strategies for weight-shifting and static and dynamic balance drills. Complete drills encouraging longer steps and bending/squatting to improve stability with these specific activities. Train multi-directional stepping to improve equilibrium coordination by doing cone reaches for that forward and downward reaching while maintaining balance.     Cora Collum, Student-PT 12/31/2022 8:00 AM   Consuela Mimes, PT, DPT [phone]

## 2022-12-27 ENCOUNTER — Encounter: Payer: Self-pay | Admitting: Physical Therapy

## 2022-12-27 ENCOUNTER — Ambulatory Visit: Payer: Medicare HMO | Attending: Family Medicine | Admitting: Physical Therapy

## 2022-12-27 DIAGNOSIS — R2689 Other abnormalities of gait and mobility: Secondary | ICD-10-CM | POA: Insufficient documentation

## 2022-12-27 DIAGNOSIS — Z9181 History of falling: Secondary | ICD-10-CM | POA: Insufficient documentation

## 2022-12-27 DIAGNOSIS — M6281 Muscle weakness (generalized): Secondary | ICD-10-CM | POA: Insufficient documentation

## 2022-12-27 DIAGNOSIS — R262 Difficulty in walking, not elsewhere classified: Secondary | ICD-10-CM | POA: Insufficient documentation

## 2023-01-01 ENCOUNTER — Ambulatory Visit: Payer: Medicare HMO | Admitting: Physical Therapy

## 2023-01-01 ENCOUNTER — Encounter: Payer: Self-pay | Admitting: Physical Therapy

## 2023-01-01 DIAGNOSIS — Z9181 History of falling: Secondary | ICD-10-CM | POA: Diagnosis not present

## 2023-01-01 DIAGNOSIS — R262 Difficulty in walking, not elsewhere classified: Secondary | ICD-10-CM

## 2023-01-01 DIAGNOSIS — R2689 Other abnormalities of gait and mobility: Secondary | ICD-10-CM

## 2023-01-01 DIAGNOSIS — M6281 Muscle weakness (generalized): Secondary | ICD-10-CM

## 2023-01-01 NOTE — Therapy (Signed)
OUTPATIENT PHYSICAL THERAPY TREATMENT    Patient Name: Barbara Ballard MRN: [id number] DOB:[birth year], 77 y.o., female Today's Date: 01/01/23  PCP: [facility], [place]  REFERRING PROVIDER: Ardyth Man, PA-C   END OF SESSION:   PT End of Session - 01/01/23 1409     Visit Number 11    Number of Visits 17    Date for PT Re-Evaluation 01/22/23    PT Start Time 1300    PT Stop Time 1348    PT Time Calculation (min) 48 min    Equipment Utilized During Treatment Gait belt    Activity Tolerance Patient tolerated treatment well    Behavior During Therapy WFL for tasks assessed/performed                 Past Medical History:  Diagnosis Date   Depression    High cholesterol    Hypertension    Restless leg    Past Surgical History:  Procedure Laterality Date   BUNIONECTOMY     CHOLECYSTECTOMY     Patient Active Problem List   Diagnosis Date Noted   Depression 03/21/2022   Essential hypertension 03/21/2022   Hyperlipidemia 03/21/2022   Restless leg syndrome 03/21/2022   Preoperative evaluation to rule out surgical contraindication 07/17/2021   Dermatochalasis of upper and lower eyelids of both eyes 02/23/2021   Diplopia 02/23/2021   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   Nuclear sclerotic cataract of both eyes 02/23/2021   Bilateral sensorineural hearing loss 09/09/2020   Orthostatic hypotension 07/15/2020   Grief 12/23/2019   Hypokalemia 12/23/2019   Osteopenia of spine 08/04/2019   Moderate episode of recurrent major depressive disorder (HCC) 08/12/2018   Dental decay 03/19/2018   Closed displaced fracture of distal phalanx of left middle finger with routine healing 12/24/2016   Fracture of lumbar spine (HCC) 04/11/2014   AK (actinic keratosis) 11/04/2012   Neuropathy 05/05/2012   Personal history of other malignant neoplasm of skin 04/22/2012    REFERRING DIAG:  R29.6 (ICD-10-CM) - Repeated falls     THERAPY DIAG:  History of falling  Difficulty in walking, not elsewhere classified  Muscle weakness (generalized)  Imbalance  Rationale for Evaluation and Treatment Rehabilitation  PERTINENT HISTORY: Patient is a 77 year old female referred to outpatient PT for frequent falls following hospital discharge (pt was admitted 10/16/22 due to pneumonia and had positive urinalysis for infection). Patient currently reports that her condition has improved, but she still has not gotten her voice back. Pt denies chest pain or SOB with getting around. Pt reports difficulty with balance and fear of falling. She reports unsteadiness on her feet that is worse after prolonged sitting. Pt reports LOB with bending down e.g. doing gardening and trying to get up. She reports no major change since hospitalization with this. Pt reports neuropathy affecting her feet.      Pain: No Numbness/Tingling: Yes, numbness in feet and paresthesias in feet/toes  Focal Weakness: No Recent changes in overall health/medication: Yes, hospitalization in November  Prior history of physical therapy for balance:  Yes, history of PT for balance in 2916  Falls: Has patient fallen in last 6 months? Yes, Number of falls: 3-4 times, non-injurious falls  Directional pattern for falls: Yes, forward  Imaging: No  Prior level of function: Independent Occupational demands: N/A Hobbies: walking, gardening    Red flags (bowel/bladder changes, saddle paresthesia, personal history of cancer, h/o spinal tumors, h/o compression fx, h/o abdominal aneurysm, abdominal pain,  chills/fever, night sweats, nausea, vomiting, unrelenting pain): Positive for history of skin cancer only on patient's chart   Weight Bearing Restrictions: No   Living Environment Lives with: lives alone, her son lives nearby; daughter lives in [place]  Lives in: House/apartment,  home is 2 levels. She has spiral staircase to get to 2nd floor with handrail on one  side. Her bedroom/bathroom is downstairs.  2 steps to get into home at back entrance, handrail on both sides. Has following equipment at home: Single point cane, shower chair, Grab bars, and bars for toilet transfer      Patient Goals: "Strengthen my balance", more endurance     PRECAUTIONS: Fall history  SUBJECTIVE:                                                                                                                                                                                      SUBJECTIVE STATEMENT:  Patient reported a fall this past weekend when she took her dog to the [employer]. She states that she fell backwards onto her bottom when she was leaving outside. Pt states that she did not hit her head and that she does not feel sore from the fall. Pt reports no changes in medication.     OBJECTIVE: (objective measures completed at initial evaluation unless otherwise dated)   Patient Surveys  FOTO: 21 ,predicted improvement to 70 ABC: 36.9%     GAIT: Distance walked: 150 Assistive device utilized: None Level of assistance: SBA Comments: Decreased arm swing and truncal rigidity, mild forward head and kyphotic posture maintained throughout gait cycle. Pt demonstrates decreased step length and increased cadence. Pt has sound heel strike bilaterally. No pelvic asymmetry or pelvic drop.      Posture: FHRS posture, increased thoracic kyphosis       LE MMT:   MMT (out of 5) Right 11/27/2022 Left 11/27/2022  Hip flexion 4- 4-  Hip extension      Hip abduction (seated) 4 4  Hip adduction (seated)  5 5  Hip internal rotation      Hip external rotation      Knee flexion 5 4+  Knee extension 4+ 5-  Ankle dorsiflexion 4+ 4+  Ankle plantarflexion      Ankle inversion      Ankle eversion      (* = pain; Blank rows = not tested)     Sensation Loss of light touch sensation in bilateral digits of feet      Reflexes Deferred     Cranial Nerves Deferred      Coordination/Cerebellar Finger to Nose: WNL Heel to Shin: WNL Rapid alternating movements: WNL Finger  Opposition: WNL Pronator Drift: Negative     FUNCTIONAL OUTCOME MEASURES     Results Comments  BERG 47/56 Fall risk, in need of intervention  DGI 15/24    TUG 13.63 seconds Fall risk, in need of intervention    5TSTS 18 seconds Fall risk, in need of intervention    6 Minute Walk Test 980 ft    (Blank rows = not tested)       TODAY'S TREATMENT      Neuromuscular Re-education - for improved sensory integration, static and dynamic postural control, equilibrium and non-equilibrium coordination as needed for negotiating home and community environment and stepping over obstacles  (All exercises performed with careful CGA) In parallel bars: Forward and retro stepping; 5x D/B  - minimal cueing on a slight forward lean when retro stepping  Blue star 8-way multidirectional stepping; 3x8  ea dir with bilat LE, intermittent cueing for technique  Consecutive hurdle forward step-over; (3) 6-inch hurdles and 1 Airex pad ; 2x5 D/B with step-to pattern in the hallway  Semi-tandem standing; 2x45 sec in each position (RLE in back and LLE in back)   *not today* Cone reaching, pt reaching outside of BOS to challenge static postural control; 3x7 cones with L and R hand Hurdle step over 5-lb Dbell; 2x10 with each LE  -heavy cueing for heel to toe stepping; cued for decreased upper limb support Standing feet together with perturbations; x 1 minutes  Toe tapping, 6-inch step; alternating R/L, 2x10 High knees; 5x D/B Standing feet together on Airex; 2x30 sec Stride stance with perturbations, RLE in back and LLE in back; x 1 minute each  Therapeutic Exercise - improved strength as needed to improve performance of CKC activities/functional movements and as needed for power production to prevent fall during episode of large postural perturbation    NuStep; 5 minutes; Level 3, seat at 8,  arms at 10   -subjective information gathered during this time    - Performance of 6MWT for community ambulation and cardiopulmonary endurance.      *not today* Sit to stand with Airex under feet; 2x10  3-way hip in standing; BUE support in parallel bars, with 4-lb ankle weights; 1x8 each direction bilaterally    PATIENT EDUCATION:  Education details: HEP review Person educated: Patient Education method: Explanation and Handouts Education comprehension: verbalized understanding     HOME EXERCISE PROGRAM: Access Code: [email or web address] URL: [email or web address] Date: 12/04/2022 Prepared by: Valentina Gu  Exercises - Sit to Stand with Counter Support  - 2 x daily - 7 x weekly - 2 sets - 10 reps - Heel Toe Raises with Counter Support  - 2 x daily - 7 x weekly - 2 sets - 10 reps - Standing March with Counter Support  - 2 x daily - 7 x weekly - 2 sets - 10 reps - Standing Hip Abduction with Unilateral Counter Support  - 2 x daily - 7 x weekly - 2 sets - 10 reps     ASSESSMENT:   CLINICAL IMPRESSION: Patient demonstrated a decrease in distance and confidence for the 6MWT and might have been effected by previous fall a couple of days ago and confidence to perform the activity. Pt had difficulty with controlling speed when performing retro walking. Pt demonstrated difficulty with initiation of the first step after turning during the 6MWT. Pt has remaining deficits in postural control, lower extremity strength, equilibrium lower limb coordination, and impaired proprioception/kinesthetic sense of lower limbs associated with neuropathy. Patient will benefit  from continued skilled therapeutic intervention to address the above deficits as needed for improved function and QoL.     REHAB POTENTIAL: Good   CLINICAL DECISION MAKING: Evolving/moderate complexity   EVALUATION COMPLEXITY: High     GOALS: Goals reviewed with patient? No   SHORT TERM GOALS: Target date:  12/18/2022   Pt will be independent with HEP in order to improve strength and balance in order to decrease fall risk and improve function at home. Baseline: 11/27/22: Baseline HEP initiated Goal status: ACHIEVED     LONG TERM GOALS: Target date: 01/22/2023   Pt will increase FOTO to at least 70 to demonstrate significant improvement in function at home related to balance  Baseline: 11/27/22: 64 12/27/22: 50 Goal status: NOT MET   2.  Pt will improve BERG by at least 3 points in order to demonstrate clinically significant improvement in balance.   Baseline: 11/27/22: BERG 47/56. 12/27/22: BERG 47/56  Goal status: ON GOING   3.  Pt will improve ABC by at least 13% in order to demonstrate clinically significant improvement in balance confidence.      Baseline: 11/27/22: 36.9%  12/27/22 : 45.6% Goal status: IN PROGRESS   4. Pt will decrease 5TSTS by at least 3 seconds in order to demonstrate clinically significant improvement in LE strength      Baseline: 11/27/22: 18 sec  12/27/22: 17.12 sec Goal status: ON GOING   5. Pt will improve DGI by at least 3 points in order to demonstrate clinically significant improvement in balance and decreased risk for falls.     Baseline: 11/27/22: Deferred to visit # 2.      11/29/22: 15/24     12/27/22:12/24 Goal status: NOT MET   6. Pt will decrease TUG to below 12 seconds/decrease in order to demonstrate decreased fall risk.  Baseline: 11/27/22: 13.6 sec     12/27/22: 11.5 sec Goal status: ACHIEVED   7. Pt will increase 6MWT by at least 44m (158ft) in order to demonstrate clinically significant improvement in cardiopulmonary endurance and community ambulation  Baseline: 11/27/22: Deferred to visit # 2.   11/29/22: 980 feet.  12/27/22:Deferred  01/01/23: 840 ft. Goal status: ON GOING      PLAN: PT FREQUENCY: 2x/week   PT DURATION: 8 weeks   PLANNED INTERVENTIONS: Therapeutic exercises, Therapeutic activity, Neuromuscular re-education, Balance training, Gait training,  Patient/Family education, Electrical stimulation   PLAN FOR NEXT SESSION: Continued with strategies for weight-shifting and static and dynamic balance drills. Complete drills encouraging longer steps and bending/squatting to improve stability with these specific activities. Train multi-directional stepping to improve equilibrium coordination by doing cone reaches for that forward and downward reaching while maintaining balance.     Vonna Drafts, Student-PT 01/02/2023 2:01 PM   Valentina Gu, PT, DPT [phone]

## 2023-01-03 ENCOUNTER — Encounter: Payer: Self-pay | Admitting: Physical Therapy

## 2023-01-03 ENCOUNTER — Ambulatory Visit: Payer: Medicare HMO | Admitting: Physical Therapy

## 2023-01-03 DIAGNOSIS — M6281 Muscle weakness (generalized): Secondary | ICD-10-CM

## 2023-01-03 DIAGNOSIS — Z9181 History of falling: Secondary | ICD-10-CM | POA: Diagnosis not present

## 2023-01-03 DIAGNOSIS — R2689 Other abnormalities of gait and mobility: Secondary | ICD-10-CM

## 2023-01-03 DIAGNOSIS — R262 Difficulty in walking, not elsewhere classified: Secondary | ICD-10-CM

## 2023-01-03 NOTE — Therapy (Signed)
OUTPATIENT PHYSICAL THERAPY TREATMENT    Patient Name: Barbara Ballard MRN: [id number] DOB:03/06/1946, 77 y.o., female Today's Date: 01/03/23  PCP: [facility], [place]  REFERRING PROVIDER: Hilton Sinclair, PA-C   END OF SESSION:   PT End of Session - 01/06/23 2113     Visit Number 12    Number of Visits 17    Date for PT Re-Evaluation 01/22/23    PT Start Time 0946    PT Stop Time 1031    PT Time Calculation (min) 45 min    Equipment Utilized During Treatment Gait belt    Activity Tolerance Patient tolerated treatment well    Behavior During Therapy [facility] for tasks assessed/performed                   Past Medical History:  Diagnosis Date   Depression    High cholesterol    Hypertension    Restless leg    Past Surgical History:  Procedure Laterality Date   BUNIONECTOMY     CHOLECYSTECTOMY     Patient Active Problem List   Diagnosis Date Noted   Depression 03/21/2022   Essential hypertension 03/21/2022   Hyperlipidemia 03/21/2022   Restless leg syndrome 03/21/2022   Preoperative evaluation to rule out surgical contraindication 07/17/2021   Dermatochalasis of upper and lower eyelids of both eyes 02/23/2021   Diplopia 02/23/2021   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   Nuclear sclerotic cataract of both eyes 02/23/2021   Bilateral sensorineural hearing loss 09/09/2020   Orthostatic hypotension 07/15/2020   Grief 12/23/2019   Hypokalemia 12/23/2019   Osteopenia of spine 08/04/2019   Moderate episode of recurrent major depressive disorder ([place]) 08/12/2018   Dental decay 03/19/2018   Closed displaced fracture of distal phalanx of left middle finger with routine healing 12/24/2016   Fracture of lumbar spine ([place]) 04/11/2014   AK (actinic keratosis) 11/04/2012   Neuropathy 05/05/2012   Personal history of other malignant neoplasm of skin 04/22/2012    REFERRING DIAG:  R29.6 (ICD-10-CM) - Repeated  falls    THERAPY DIAG:  History of falling  Difficulty in walking, not elsewhere classified  Muscle weakness (generalized)  Imbalance  Rationale for Evaluation and Treatment Rehabilitation  PERTINENT HISTORY: Patient is a 77 year old female referred to outpatient PT for frequent falls following hospital discharge (pt was admitted 10/16/22 due to pneumonia and had positive urinalysis for infection). Patient currently reports that her condition has improved, but she still has not gotten her voice back. Pt denies chest pain or SOB with getting around. Pt reports difficulty with balance and fear of falling. She reports unsteadiness on her feet that is worse after prolonged sitting. Pt reports LOB with bending down e.g. doing gardening and trying to get up. She reports no major change since hospitalization with this. Pt reports neuropathy affecting her feet.      Pain: No Numbness/Tingling: Yes, numbness in feet and paresthesias in feet/toes  Focal Weakness: No Recent changes in overall health/medication: Yes, hospitalization in November  Prior history of physical therapy for balance:  Yes, history of PT for balance in 2916  Falls: Has patient fallen in last 6 months? Yes, Number of falls: 3-4 times, non-injurious falls  Directional pattern for falls: Yes, forward  Imaging: No  Prior level of function: Independent Occupational demands: N/A Hobbies: walking, gardening    Red flags (bowel/bladder changes, saddle paresthesia, personal history of cancer, h/o spinal tumors, h/o compression fx, h/o abdominal aneurysm,  abdominal pain, chills/fever, night sweats, nausea, vomiting, unrelenting pain): Positive for history of skin cancer only on patient's chart   Weight Bearing Restrictions: No   Living Environment Lives with: lives alone, her son lives nearby; daughter lives in [place]  Lives in: House/apartment,  home is 2 levels. She has spiral staircase to get to 2nd floor with handrail on  one side. Her bedroom/bathroom is downstairs.  2 steps to get into home at back entrance, handrail on both sides. Has following equipment at home: Single point cane, shower chair, Grab bars, and bars for toilet transfer      Patient Goals: "Strengthen my balance", more endurance     PRECAUTIONS: Fall history  SUBJECTIVE:                                                                                                                                                                                      SUBJECTIVE STATEMENT:  Pt reports no major incidences and no changes in medication. Pt states that she went for a longer walk yesterday and did well with that activity. 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     OBJECTIVE: (objective measures completed at initial evaluation unless otherwise dated)   Patient Surveys  FOTO: 53 ,predicted improvement to 70 ABC: 36.9%     GAIT: Distance walked: 150 Assistive device utilized: None Level of assistance: SBA Comments: Decreased arm swing and truncal rigidity, mild forward head and kyphotic posture maintained throughout gait cycle. Pt demonstrates decreased step length and increased cadence. Pt has sound heel strike bilaterally. No pelvic asymmetry or pelvic drop.      Posture: FHRS posture, increased thoracic kyphosis       LE MMT:   MMT (out of 5) Right 11/27/2022 Left 11/27/2022  Hip flexion 4- 4-  Hip extension      Hip abduction (seated) 4 4  Hip adduction (seated)  5 5  Hip internal rotation      Hip external rotation      Knee flexion 5 4+  Knee extension 4+ 5-  Ankle dorsiflexion 4+ 4+  Ankle plantarflexion      Ankle inversion      Ankle eversion      (* = pain; Blank rows = not tested)     Sensation Loss of light touch sensation in bilateral digits of feet      Reflexes Deferred     Cranial  Nerves Deferred     Coordination/Cerebellar Finger to Nose: WNL Heel to Shin: WNL Rapid alternating movements:  WNL Finger Opposition: WNL Pronator Drift: Negative     FUNCTIONAL OUTCOME MEASURES     Results Comments  BERG 47/56 Fall risk, in need of intervention  DGI 15/24    TUG 13.63 seconds Fall risk, in need of intervention    5TSTS 18 seconds Fall risk, in need of intervention    6 Minute Walk Test 980 ft    (Blank rows = not tested)       TODAY'S TREATMENT      Neuromuscular Re-education - for improved sensory integration, static and dynamic postural control, equilibrium and non-equilibrium coordination as needed for negotiating home and community environment and stepping over obstacles  (All exercises performed with careful CGA) In parallel bars: Forward and retro stepping; 5x D/B  Consecutive hurdle forward step-over; (3) 6-inch hurdles; 2x5 D/B with step-to pattern length of // bars  Semi-tandem standing; 2x45 sec in each position (RLE in back and LLE in back) Standing feet together with perturbations; 3x45 sec   Toe tapping, 6-inch step; alternating R/L, 3x10  *not today* Cone reaching, pt reaching outside of BOS to challenge static postural control; 3x7 cones with L and R hand Hurdle step over 5-lb Dbell; 2x10 with each LE  -heavy cueing for heel to toe stepping; cued for decreased upper limb support Blue star 8-way multidirectional stepping; 3x8  ea dir with bilat LE, intermittent cueing for technique High knees; 5x D/B Standing feet together on Airex; 2x30 sec Stride stance with perturbations, RLE in back and LLE in back; x 1 minute each  Therapeutic Exercise - improved strength as needed to improve performance of CKC activities/functional movements and as needed for power production to prevent fall during episode of large postural perturbation    NuStep; 5 minutes; Level 3, seat at 8, arms at 10   -subjective information gathered during this  time    3-way hip in standing; BUE support in parallel bars, with 4-lb ankle weights; 2x10 each direction bilaterally     *not today* Sit to stand with Airex under feet; 2x10     PATIENT EDUCATION:  Education details: HEP review Person educated: Patient Education method: Explanation and Handouts Education comprehension: verbalized understanding     HOME EXERCISE PROGRAM: Access Code: [email or web address] URL: [email or web address] Date: 12/04/2022 Prepared by: Valentina Gu  Exercises - Sit to Stand with Counter Support  - 2 x daily - 7 x weekly - 2 sets - 10 reps - Heel Toe Raises with Counter Support  - 2 x daily - 7 x weekly - 2 sets - 10 reps - Standing March with Counter Support  - 2 x daily - 7 x weekly - 2 sets - 10 reps - Standing Hip Abduction with Unilateral Counter Support  - 2 x daily - 7 x weekly - 2 sets - 10 reps     ASSESSMENT:   CLINICAL IMPRESSION: Patient performed well with consecutive hurdles and handled feet together perturbations with ease. Pt required moderate cueing for slow and controlled technique with 3-way hip exercise and was fatigue on the last set of the exercise. Pt demonstrated better control in retro walking today with no LOB or need for abrupt stepping strategy to re-gain postural stability. Pt has remaining deficits in postural control, lower extremity strength, equilibrium lower limb coordination, and impaired proprioception/kinesthetic sense of lower limbs associated with neuropathy. Patient will benefit from continued skilled therapeutic intervention to address the above deficits as needed for improved function and QoL.     REHAB POTENTIAL: Good  CLINICAL DECISION MAKING: Evolving/moderate complexity   EVALUATION COMPLEXITY: High     GOALS: Goals reviewed with patient? No   SHORT TERM GOALS: Target date: 12/18/2022   Pt will be independent with HEP in order to improve strength and balance in order to decrease fall risk and  improve function at home. Baseline: 11/27/22: Baseline HEP initiated Goal status: ACHIEVED     LONG TERM GOALS: Target date: 01/22/2023   Pt will increase FOTO to at least 70 to demonstrate significant improvement in function at home related to balance  Baseline: 11/27/22: 64 12/27/22: 50 Goal status: NOT MET   2.  Pt will improve BERG by at least 3 points in order to demonstrate clinically significant improvement in balance.   Baseline: 11/27/22: BERG 47/56. 12/27/22: BERG 47/56  Goal status: ON GOING   3.  Pt will improve ABC by at least 13% in order to demonstrate clinically significant improvement in balance confidence.      Baseline: 11/27/22: 36.9%  12/27/22 : 45.6% Goal status: IN PROGRESS   4. Pt will decrease 5TSTS by at least 3 seconds in order to demonstrate clinically significant improvement in LE strength      Baseline: 11/27/22: 18 sec  12/27/22: 17.12 sec Goal status: ON GOING   5. Pt will improve DGI by at least 3 points in order to demonstrate clinically significant improvement in balance and decreased risk for falls.     Baseline: 11/27/22: Deferred to visit # 2.      11/29/22: 15/24     12/27/22:12/24 Goal status: NOT MET   6. Pt will decrease TUG to below 12 seconds/decrease in order to demonstrate decreased fall risk.  Baseline: 11/27/22: 13.6 sec     12/27/22: 11.5 sec Goal status: ACHIEVED   7. Pt will increase 6MWT by at least 38m(1685f in order to demonstrate clinically significant improvement in cardiopulmonary endurance and community ambulation  Baseline: 11/27/22: Deferred to visit # 2.   11/29/22: 980 feet.  12/27/22:Deferred  01/01/23: 840 ft. Goal status: ON GOING      PLAN: PT FREQUENCY: 2x/week   PT DURATION: 8 weeks   PLANNED INTERVENTIONS: Therapeutic exercises, Therapeutic activity, Neuromuscular re-education, Balance training, Gait training, Patient/Family education, Electrical stimulation   PLAN FOR NEXT SESSION: Continued with strategies for weight-shifting and static  and dynamic balance drills. Complete drills encouraging longer steps and bending/squatting to improve stability with these specific activities. Train multi-directional stepping to improve equilibrium coordination by doing cone reaches for that forward and downward reaching while maintaining balance.     SaVonna DraftsStudent-PT 01/06/2023 9:13 PM   JeValentina GuPT, DPT #[phone]

## 2023-01-08 ENCOUNTER — Encounter: Payer: Medicare HMO | Admitting: Physical Therapy

## 2023-01-10 ENCOUNTER — Encounter: Payer: Self-pay | Admitting: Physical Therapy

## 2023-01-10 ENCOUNTER — Ambulatory Visit: Payer: Medicare HMO | Admitting: Physical Therapy

## 2023-01-10 DIAGNOSIS — R262 Difficulty in walking, not elsewhere classified: Secondary | ICD-10-CM

## 2023-01-10 DIAGNOSIS — Z9181 History of falling: Secondary | ICD-10-CM | POA: Diagnosis not present

## 2023-01-10 DIAGNOSIS — M6281 Muscle weakness (generalized): Secondary | ICD-10-CM

## 2023-01-10 DIAGNOSIS — R2689 Other abnormalities of gait and mobility: Secondary | ICD-10-CM

## 2023-01-10 NOTE — Therapy (Signed)
OUTPATIENT PHYSICAL THERAPY TREATMENT    Patient Name: Barbara Ballard MRN: [id number] DOB:[birth year], 77 y.o., female Today's Date: 01/10/23  PCP: [facility], [place] PROVIDER: Hilton Sinclair, PA-C   END OF SESSION:   PT End of Session - 01/14/23 1048     Visit Number 13    Number of Visits 17    Date for PT Re-Evaluation 01/22/23    PT Start Time 0730    PT Stop Time 0818    PT Time Calculation (min) 48 min    Equipment Utilized During Treatment Gait belt    Activity Tolerance Patient tolerated treatment well    Behavior During Therapy [facility] for tasks assessed/performed             Past Medical History:  Diagnosis Date   Depression    High cholesterol    Hypertension    Restless leg    Past Surgical History:  Procedure Laterality Date   BUNIONECTOMY     CHOLECYSTECTOMY     Patient Active Problem List   Diagnosis Date Noted   Depression 03/21/2022   Essential hypertension 03/21/2022   Hyperlipidemia 03/21/2022   Restless leg syndrome 03/21/2022   Preoperative evaluation to rule out surgical contraindication 07/17/2021   Dermatochalasis of upper and lower eyelids of both eyes 02/23/2021   Diplopia 02/23/2021   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   Nuclear sclerotic cataract of both eyes 02/23/2021   Bilateral sensorineural hearing loss 09/09/2020   Orthostatic hypotension 07/15/2020   Grief 12/23/2019   Hypokalemia 12/23/2019   Osteopenia of spine 08/04/2019   Moderate episode of recurrent major depressive disorder ([place]) 08/12/2018   Dental decay 03/19/2018   Closed displaced fracture of distal phalanx of left middle finger with routine healing 12/24/2016   Fracture of lumbar spine ([place]) 04/11/2014   AK (actinic keratosis) 11/04/2012   Neuropathy 05/05/2012   Personal history of other malignant neoplasm of skin 04/22/2012    REFERRING DIAG:  R29.6 (ICD-10-CM) - Repeated falls     THERAPY DIAG:  History of falling  Difficulty in walking, not elsewhere classified  Muscle weakness (generalized)  Imbalance  Rationale for Evaluation and Treatment Rehabilitation  PERTINENT HISTORY: Patient is a 77 year old female referred to outpatient PT for frequent falls following hospital discharge (pt was admitted 10/16/22 due to pneumonia and had positive urinalysis for infection). Patient currently reports that her condition has improved, but she still has not gotten her voice back. Pt denies chest pain or SOB with getting around. Pt reports difficulty with balance and fear of falling. She reports unsteadiness on her feet that is worse after prolonged sitting. Pt reports LOB with bending down e.g. doing gardening and trying to get up. She reports no major change since hospitalization with this. Pt reports neuropathy affecting her feet.   Pain: No Numbness/Tingling: Yes, numbness in feet and paresthesias in feet/toes  Focal Weakness: No Recent changes in overall health/medication: Yes, hospitalization in November  Prior history of physical therapy for balance:  Yes, history of PT for balance in 2916  Falls: Has patient fallen in last 6 months? Yes, Number of falls: 3-4 times, non-injurious falls  Directional pattern for falls: Yes, forward  Imaging: No  Prior level of function: Independent Occupational demands: N/A Hobbies: walking, gardening    Red flags (bowel/bladder changes, saddle paresthesia, personal history of cancer, h/o spinal tumors, h/o compression fx, h/o abdominal aneurysm, abdominal pain, chills/fever, night sweats, nausea, vomiting, unrelenting pain):  Positive for history of skin cancer only on patient's chart   Weight Bearing Restrictions: No   Living Environment Lives with: lives alone, her son lives nearby; daughter lives in [place]  Lives in: House/apartment,  home is 2 levels. She has spiral staircase to get to 2nd floor with handrail on one side. Her  bedroom/bathroom is downstairs.  2 steps to get into home at back entrance, handrail on both sides. Has following equipment at home: Single point cane, shower chair, Grab bars, and bars for toilet transfer      Patient Goals: "Strengthen my balance", more endurance     PRECAUTIONS: Fall history  SUBJECTIVE:                                                                                                                                                                                      SUBJECTIVE STATEMENT:  Pt reports that this morning she stumbled getting out of the door getting out of the garage. The garage has stairs and she was able to grab the railing to prevent the fall.     OBJECTIVE: (objective measures completed at initial evaluation unless otherwise dated)   Patient Surveys  FOTO: 14 ,predicted improvement to 70 ABC: 36.9%     GAIT: Distance walked: 150 Assistive device utilized: None Level of assistance: SBA Comments: Decreased arm swing and truncal rigidity, mild forward head and kyphotic posture maintained throughout gait cycle. Pt demonstrates decreased step length and increased cadence. Pt has sound heel strike bilaterally. No pelvic asymmetry or pelvic drop.      Posture: FHRS posture, increased thoracic kyphosis       LE MMT:   MMT (out of 5) Right 11/27/2022 Left 11/27/2022  Hip flexion 4- 4-  Hip extension      Hip abduction (seated) 4 4  Hip adduction (seated)  5 5  Hip internal rotation      Hip external rotation      Knee flexion 5 4+  Knee extension 4+ 5-  Ankle dorsiflexion 4+ 4+  Ankle plantarflexion      Ankle inversion      Ankle eversion      (* = pain; Blank rows = not tested)     Sensation Loss of light touch sensation in bilateral digits of feet      Reflexes Deferred     Cranial Nerves Deferred     Coordination/Cerebellar Finger to Nose: WNL Heel to Shin: WNL Rapid alternating movements: WNL Finger Opposition:  WNL Pronator Drift: Negative     FUNCTIONAL OUTCOME MEASURES     Results Comments  BERG 47/56 Fall risk, in need of intervention  DGI 15/24    TUG 13.63 seconds Fall risk, in need of intervention    5TSTS 18 seconds Fall risk, in need of intervention    6 Minute Walk Test 980 ft    (Blank rows = not tested)       TODAY'S TREATMENT      Neuromuscular Re-education - for improved sensory integration, static and dynamic postural control, equilibrium and non-equilibrium coordination as needed for negotiating home and community environment and stepping over obstacles  (All exercises performed with careful CGA) In parallel bars: Forward and retro stepping; 5x D/B  Sidestepping length // bars; 5x D/B  Consecutive obstacle negotiations; (3) 6-inch hurdles, Airex pad, 6-inch step, Airex pad; x5 D/B with step-to pattern in hallway  Stride stance ; 3x30 sec in each position (RLE in back and LLE in back)  Standing feet together with perturbations; 1x45 sec   Resisted forward stepping with controlled retro step to train tendency for posterior bias; x5  Cone reaching, pt reaching outside of BOS to challenge static postural control; 3x7 cones with L and R hand   *not today* Toe tapping, 6-inch step; alternating R/L, 3x10 Hurdle step over 5-lb Dbell; 2x10 with each LE  -heavy cueing for heel to toe stepping; cued for decreased upper limb support Blue star 8-way multidirectional stepping; 3x8  ea dir with bilat LE, intermittent cueing for technique High knees; 5x D/B Standing feet together on Airex; 2x30 sec Stride stance with perturbations, RLE in back and LLE in back; x 1 minute each  Therapeutic Exercise - improved strength as needed to improve performance of CKC activities/functional movements and as needed for power production to prevent fall during episode of large postural perturbation    NuStep; 5 minutes; Level 3, seat at 8, arms at 10   -subjective information gathered  during this time        *not today* Sit to stand with Airex under feet; 2x10  3-way hip in standing; BUE support in parallel bars, with 4-lb ankle weights; 2x10 each direction bilaterally    PATIENT EDUCATION:  Education details: HEP review Person educated: Patient Education method: Explanation and Handouts Education comprehension: verbalized understanding     HOME EXERCISE PROGRAM: Access Code: [email or web address] URL: [email or web address] Date: 12/04/2022 Prepared by: Valentina Gu  Exercises - Sit to Stand with Counter Support  - 2 x daily - 7 x weekly - 2 sets - 10 reps - Heel Toe Raises with Counter Support  - 2 x daily - 7 x weekly - 2 sets - 10 reps - Standing March with Counter Support  - 2 x daily - 7 x weekly - 2 sets - 10 reps - Standing Hip Abduction with Unilateral Counter Support  - 2 x daily - 7 x weekly - 2 sets - 10 reps     ASSESSMENT:   CLINICAL IMPRESSION: Pt was fatigued by the consecutive obstacle negotiations. Pt had difficulty with stepping onto 6-inch step and had a posterior bias during this task. Patient performed well with feet together perturbations and static standing in stride stance. Pt demonstrated better control in retro walking with resistance today with no LOB or need for abrupt stepping strategy to re-gain postural stability. Pt has remaining deficits in postural control, lower extremity strength, equilibrium lower limb coordination, and impaired proprioception/kinesthetic sense of lower limbs associated with neuropathy. Patient will benefit from continued skilled therapeutic intervention to address the above deficits as needed for improved function and QoL.     REHAB POTENTIAL:  Good   CLINICAL DECISION MAKING: Evolving/moderate complexity   EVALUATION COMPLEXITY: High     GOALS: Goals reviewed with patient? No   SHORT TERM GOALS: Target date: 12/18/2022   Pt will be independent with HEP in order to improve strength and  balance in order to decrease fall risk and improve function at home. Baseline: 11/27/22: Baseline HEP initiated Goal status: ACHIEVED     LONG TERM GOALS: Target date: 01/22/2023   Pt will increase FOTO to at least 70 to demonstrate significant improvement in function at home related to balance  Baseline: 11/27/22: 64 12/27/22: 50 Goal status: NOT MET   2.  Pt will improve BERG by at least 3 points in order to demonstrate clinically significant improvement in balance.   Baseline: 11/27/22: BERG 47/56. 12/27/22: BERG 47/56  Goal status: ON GOING   3.  Pt will improve ABC by at least 13% in order to demonstrate clinically significant improvement in balance confidence.      Baseline: 11/27/22: 36.9%  12/27/22 : 45.6% Goal status: IN PROGRESS   4. Pt will decrease 5TSTS by at least 3 seconds in order to demonstrate clinically significant improvement in LE strength      Baseline: 11/27/22: 18 sec  12/27/22: 17.12 sec Goal status: ON GOING   5. Pt will improve DGI by at least 3 points in order to demonstrate clinically significant improvement in balance and decreased risk for falls.     Baseline: 11/27/22: Deferred to visit # 2.      11/29/22: 15/24     12/27/22:12/24 Goal status: NOT MET   6. Pt will decrease TUG to below 12 seconds/decrease in order to demonstrate decreased fall risk.  Baseline: 11/27/22: 13.6 sec     12/27/22: 11.5 sec Goal status: ACHIEVED   7. Pt will increase 6MWT by at least 9m(1661f in order to demonstrate clinically significant improvement in cardiopulmonary endurance and community ambulation  Baseline: 11/27/22: Deferred to visit # 2.   11/29/22: 980 feet.  12/27/22:Deferred  01/01/23: 840 ft. Goal status: ON GOING      PLAN: PT FREQUENCY: 2x/week   PT DURATION: 8 weeks   PLANNED INTERVENTIONS: Therapeutic exercises, Therapeutic activity, Neuromuscular re-education, Balance training, Gait training, Patient/Family education, Electrical stimulation   PLAN FOR NEXT SESSION: Continued with  strategies for weight-shifting and static and dynamic balance drills. Complete drills encouraging longer steps and bending/squatting to improve stability with these specific activities. Train multi-directional stepping to improve equilibrium coordination by doing cone reaches for that forward and downward reaching while maintaining balance.     SaVonna DraftsStudent-PT 01/14/2023 10:48 AM   JeValentina GuPT, DPT #[phone]

## 2023-01-15 ENCOUNTER — Encounter: Payer: Medicare HMO | Admitting: Physical Therapy

## 2023-01-17 ENCOUNTER — Encounter: Payer: Medicare HMO | Admitting: Physical Therapy

## 2023-01-22 ENCOUNTER — Encounter: Payer: Medicare HMO | Admitting: Physical Therapy

## 2023-01-24 ENCOUNTER — Encounter: Payer: Medicare HMO | Admitting: Physical Therapy

## 2023-01-29 ENCOUNTER — Ambulatory Visit: Payer: Medicare HMO | Admitting: Physical Therapy

## 2023-01-31 ENCOUNTER — Encounter: Payer: Medicare HMO | Admitting: Physical Therapy

## 2023-02-05 ENCOUNTER — Ambulatory Visit: Payer: Medicare HMO | Attending: Family Medicine | Admitting: Physical Therapy

## 2023-02-05 DIAGNOSIS — Z9181 History of falling: Secondary | ICD-10-CM | POA: Insufficient documentation

## 2023-02-05 DIAGNOSIS — M6281 Muscle weakness (generalized): Secondary | ICD-10-CM | POA: Insufficient documentation

## 2023-02-05 DIAGNOSIS — R2689 Other abnormalities of gait and mobility: Secondary | ICD-10-CM | POA: Diagnosis present

## 2023-02-05 DIAGNOSIS — R262 Difficulty in walking, not elsewhere classified: Secondary | ICD-10-CM | POA: Diagnosis present

## 2023-02-05 NOTE — Therapy (Unsigned)
OUTPATIENT PHYSICAL THERAPY TREATMENT/GOAL UPDATE/RE-CERTIFICATION   Patient Name: CHAYANNE KREFT MRN: [id number] DOB:07-13-1946, 77 y.o., female Today's Date: 02/05/2023   PCP: [facility], [place]  REFERRING PROVIDER: Hilton Sinclair, PA-C   END OF SESSION:   PT End of Session - 02/05/23 1036     Visit Number 14    Number of Visits 17    Progress Note Due on Visit 20    PT Start Time 1033    PT Stop Time 1115    PT Time Calculation (min) 42 min    Equipment Utilized During Treatment Gait belt    Activity Tolerance Patient tolerated treatment well    Behavior During Therapy WFL for tasks assessed/performed              Past Medical History:  Diagnosis Date   Depression    High cholesterol    Hypertension    Restless leg    Past Surgical History:  Procedure Laterality Date   BUNIONECTOMY     CHOLECYSTECTOMY     Patient Active Problem List   Diagnosis Date Noted   Depression 03/21/2022   Essential hypertension 03/21/2022   Hyperlipidemia 03/21/2022   Restless leg syndrome 03/21/2022   Preoperative evaluation to rule out surgical contraindication 07/17/2021   Dermatochalasis of upper and lower eyelids of both eyes 02/23/2021   Diplopia 02/23/2021   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   Nuclear sclerotic cataract of both eyes 02/23/2021   Bilateral sensorineural hearing loss 09/09/2020   Orthostatic hypotension 07/15/2020   Grief 12/23/2019   Hypokalemia 12/23/2019   Osteopenia of spine 08/04/2019   Moderate episode of recurrent major depressive disorder ([place]) 08/12/2018   Dental decay 03/19/2018   Closed displaced fracture of distal phalanx of left middle finger with routine healing 12/24/2016   Fracture of lumbar spine ([place]) 04/11/2014   AK (actinic keratosis) 11/04/2012   Neuropathy 05/05/2012   Personal history of other malignant neoplasm of skin 04/22/2012    REFERRING DIAG:  R29.6  (ICD-10-CM) - Repeated falls    THERAPY DIAG:  History of falling  Difficulty in walking, not elsewhere classified  Muscle weakness (generalized)  Imbalance  Rationale for Evaluation and Treatment Rehabilitation  PERTINENT HISTORY: Patient is a 77 year old female referred to outpatient PT for frequent falls following hospital discharge (pt was admitted 10/16/22 due to pneumonia and had positive urinalysis for infection). Patient currently reports that her condition has improved, but she still has not gotten her voice back. Pt denies chest pain or SOB with getting around. Pt reports difficulty with balance and fear of falling. She reports unsteadiness on her feet that is worse after prolonged sitting. Pt reports LOB with bending down e.g. doing gardening and trying to get up. She reports no major change since hospitalization with this. Pt reports neuropathy affecting her feet.   Pain: No Numbness/Tingling: Yes, numbness in feet and paresthesias in feet/toes  Focal Weakness: No Recent changes in overall health/medication: Yes, hospitalization in November  Prior history of physical therapy for balance:  Yes, history of PT for balance in 2916  Falls: Has patient fallen in last 6 months? Yes, Number of falls: 3-4 times, non-injurious falls  Directional pattern for falls: Yes, forward  Imaging: No  Prior level of function: Independent Occupational demands: N/A Hobbies: walking, gardening    Red flags (bowel/bladder changes, saddle paresthesia, personal history of cancer, h/o spinal tumors, h/o compression fx, h/o abdominal aneurysm, abdominal pain, chills/fever, night sweats, nausea,  vomiting, unrelenting pain): Positive for history of skin cancer only on patient's chart   Weight Bearing Restrictions: No   Living Environment Lives with: lives alone, her son lives nearby; daughter lives in [place]  Lives in: House/apartment,  home is 2 levels. She has spiral staircase to get to 2nd floor  with handrail on one side. Her bedroom/bathroom is downstairs.  2 steps to get into home at back entrance, handrail on both sides. Has following equipment at home: Single point cane, shower chair, Grab bars, and bars for toilet transfer      Patient Goals: "Strengthen my balance", more endurance     PRECAUTIONS: Fall history  SUBJECTIVE:                                                                                                                                                                                      SUBJECTIVE STATEMENT:  Pt reports her eye lid surgery went well. Patient reports no recent near-falls or significant accidents. Patient reports 50% SANE score. Patient reports notable unsteadiness when first getting up. Patient reports no dizziness/lightheadedness with first getting up. Pt denies other specific limitations at this time.     OBJECTIVE: (objective measures completed at initial evaluation unless otherwise dated)   Patient Surveys  FOTO: 28 ,predicted improvement to 70 ABC: 36.9%     GAIT: Distance walked: 150 Assistive device utilized: None Level of assistance: SBA Comments: Decreased arm swing and truncal rigidity, mild forward head and kyphotic posture maintained throughout gait cycle. Pt demonstrates decreased step length and increased cadence. Pt has sound heel strike bilaterally. No pelvic asymmetry or pelvic drop.      Posture: FHRS posture, increased thoracic kyphosis       LE MMT:   MMT (out of 5) Right 11/27/2022 Left 11/27/2022  Hip flexion 4- 4-  Hip extension      Hip abduction (seated) 4 4  Hip adduction (seated)  5 5  Hip internal rotation      Hip external rotation      Knee flexion 5 4+  Knee extension 4+ 5-  Ankle dorsiflexion 4+ 4+  Ankle plantarflexion      Ankle inversion      Ankle eversion      (* = pain; Blank rows = not tested)     Sensation Loss of light touch sensation in bilateral digits of feet       Reflexes Deferred     Cranial Nerves Deferred     Coordination/Cerebellar Finger to Nose: WNL Heel to Shin: WNL Rapid alternating movements: WNL Finger Opposition: WNL Pronator Drift: Negative     FUNCTIONAL OUTCOME MEASURES -INITIAL  EVALUATION    Results Comments  BERG 47/56 Fall risk, in need of intervention  DGI 15/24    TUG 13.63 seconds Fall risk, in need of intervention    5TSTS 18 seconds Fall risk, in need of intervention    6 Minute Walk Test 980 ft    (Blank rows = not tested)      [facility] PT Assessment - 02/05/23 0001       Berg Balance Test   Sit to Stand Able to stand without using hands and stabilize independently    Standing Unsupported Able to stand safely 2 minutes    Sitting with Back Unsupported but Feet Supported on Floor or Stool Able to sit safely and securely 2 minutes    Stand to Sit Sits safely with minimal use of hands    Transfers Able to transfer safely, minor use of hands    Standing Unsupported with Eyes Closed Able to stand 10 seconds safely    Standing Unsupported with Feet Together Able to place feet together independently and stand 1 minute safely    From Standing, Reach Forward with Outstretched Arm Can reach forward >12 cm safely (5")    From Standing Position, Pick up Object from Floor Able to pick up shoe, needs supervision    From Standing Position, Turn to Look Behind Over each Shoulder Looks behind from both sides and weight shifts well    Turn 360 Degrees Able to turn 360 degrees safely but slowly    Standing Unsupported, Alternately Place Feet on Step/Stool Able to stand independently and complete 8 steps >20 seconds    Standing Unsupported, One Foot in Front Able to plae foot ahead of the other independently and hold 30 seconds    Standing on One Leg Able to lift leg independently and hold equal to or more than 3 seconds    Total Score 48               TODAY'S TREATMENT      Neuromuscular Re-education - for  improved sensory integration, static and dynamic postural control, equilibrium and non-equilibrium coordination as needed for negotiating home and community environment and stepping over obstacles  (All drills performed with careful CGA)  Performance of functional outcome measures (BERG, DGI, 5TSTS)  -see goal update below   *next visit* Forward and retro stepping; 5x D/B Sidestepping length // bars; 5x D/B Consecutive obstacle negotiations; (3) 6-inch hurdles, Airex pad, 6-inch step, Airex pad; x5 D/B with step-to pattern in hallway Stride stance ; 3x30 sec in each position (RLE in back and LLE in back) Standing feet together with perturbations; 1x45 sec  Resisted forward stepping with controlled retro step to train tendency for posterior bias; x5 Cone reaching, pt reaching outside of BOS to challenge static postural control; 3x7 cones with L and R hand Toe tapping, 6-inch step; alternating R/L, 3x10 Hurdle step over 5-lb Dbell; 2x10 with each LE  -heavy cueing for heel to toe stepping; cued for decreased upper limb support Blue star 8-way multidirectional stepping; 3x8  ea dir with bilat LE, intermittent cueing for technique High knees; 5x D/B Standing feet together on Airex; 2x30 sec Stride stance with perturbations, RLE in back and LLE in back; x 1 minute each     Therapeutic Exercise - improved strength as needed to improve performance of CKC activities/functional movements and as needed for power production to prevent fall during episode of large postural perturbation    NuStep; 5 minutes; Level 3, seat  at 8, arms at 10   -subjective information gathered during this time   Updated ABC Scale and FOTO      *not today* Sit to stand with Airex under feet; 2x10  3-way hip in standing; BUE support in parallel bars, with 4-lb ankle weights; 2x10 each direction bilaterally    PATIENT EDUCATION:  Education details: HEP review Person educated: Patient Education method:  Explanation and Handouts Education comprehension: verbalized understanding     HOME EXERCISE PROGRAM: Access Code: [email or web address] URL: [email or web address] Date: 12/04/2022 Prepared by: Valentina Gu  Exercises - Sit to Stand with Counter Support  - 2 x daily - 7 x weekly - 2 sets - 10 reps - Heel Toe Raises with Counter Support  - 2 x daily - 7 x weekly - 2 sets - 10 reps - Standing March with Counter Support  - 2 x daily - 7 x weekly - 2 sets - 10 reps - Standing Hip Abduction with Unilateral Counter Support  - 2 x daily - 7 x weekly - 2 sets - 10 reps     ASSESSMENT:   CLINICAL IMPRESSION: Today's visit focused on re-assessment/goal update. Pt has substantially improved 5TSTS, 6-minute walk test, and ABC Scale. Pt is on cusp of meeting her FOTO goal. BERG has not changed significantly, albeit patient did have baseline score higher than established fall risk cut-off. She reports ongoing imbalance when initially getting up from her seat. She has remaining deficits in maintaining postural control with modified BOS and is significantly challenged with obstacle negotiation. Pt has remaining deficits in postural control, lower extremity strength, equilibrium lower limb coordination, and impaired proprioception/kinesthetic sense of lower limbs associated with neuropathy. Patient will benefit from continued skilled therapeutic intervention to address the above deficits as needed for improved function and QoL.     REHAB POTENTIAL: Good   CLINICAL DECISION MAKING: Evolving/moderate complexity   EVALUATION COMPLEXITY: High     GOALS: Goals reviewed with patient? No   SHORT TERM GOALS: Target date: 12/18/2022   Pt will be independent with HEP in order to improve strength and balance in order to decrease fall risk and improve function at home. Baseline: 11/27/22: Baseline HEP initiated Goal status: ACHIEVED     LONG TERM GOALS: Target date: 01/22/2023   Pt will increase FOTO to  at least 70 to demonstrate significant improvement in function at home related to balance  Baseline: 11/27/22: 64.      12/27/22: 50.     02/05/23: 69/70 Goal status: IN PROGRESS   2.  Pt will improve BERG by at least 3 points in order to demonstrate clinically significant improvement in balance.   Baseline: 11/27/22: BERG 47/56.     12/27/22: BERG 47/56.     02/05/23:  BERG 48/56 Goal status: ON-GOING   3.  Pt will improve ABC by at least 13% in order to demonstrate clinically significant improvement in balance confidence.      Baseline: 11/27/22: 36.9%  12/27/22 : 45.6%.    02/05/23: 69.4% Goal status: ACHIEVED   4. Pt will decrease 5TSTS by at least 3 seconds in order to demonstrate clinically significant improvement in LE strength      Baseline: 11/27/22: 18 sec  12/27/22: 17.12 sec.  02/05/23: 10.4 Goal status: ACHIEVED    5. Pt will improve DGI by at least 3 points in order to demonstrate clinically significant improvement in balance and decreased risk for falls.     Baseline: 11/27/22: Deferred to visit # 2.  11/29/22: 15/24     12/27/22:12/24.     02/05/23: 12/24 Goal status: NOT MET   6. Pt will decrease TUG to below 12 seconds/decrease in order to demonstrate decreased fall risk.  Baseline: 11/27/22: 13.6 sec     12/27/22: 11.5 sec Goal status: ACHIEVED   7. Pt will increase 6MWT by at least 65m(1672f in order to demonstrate clinically significant improvement in cardiopulmonary endurance and community ambulation  Baseline: 11/27/22: Deferred to visit # 2.   11/29/22: 980 feet.  12/27/22:Deferred  01/01/23: 840 ft.     02/05/23: 1050 ft Goal status: ACHIEVED      PLAN: PT FREQUENCY: 2x/week   PT DURATION: 8 weeks   PLANNED INTERVENTIONS: Therapeutic exercises, Therapeutic activity, Neuromuscular re-education, Balance training, Gait training, Patient/Family education, Electrical stimulation   PLAN FOR NEXT SESSION: Continue with drills re-training obstacle negotiation, sit to stand, and static postural  control. Integrate activities requiring acceleration/deceleration during ambulation, dual tasking during gait requiring head turns.     JeValentina GuPT, DPT #[phone]/10/2023 10:37 AM

## 2023-02-07 ENCOUNTER — Ambulatory Visit: Payer: Medicare HMO | Admitting: Physical Therapy

## 2023-02-07 ENCOUNTER — Encounter: Payer: Self-pay | Admitting: Physical Therapy

## 2023-02-07 DIAGNOSIS — R2689 Other abnormalities of gait and mobility: Secondary | ICD-10-CM

## 2023-02-07 DIAGNOSIS — M6281 Muscle weakness (generalized): Secondary | ICD-10-CM

## 2023-02-07 DIAGNOSIS — Z9181 History of falling: Secondary | ICD-10-CM | POA: Diagnosis not present

## 2023-02-07 DIAGNOSIS — R262 Difficulty in walking, not elsewhere classified: Secondary | ICD-10-CM

## 2023-02-07 NOTE — Addendum Note (Signed)
Addended by: Valentina Gu T on: 02/07/2023 08:00 AM   Modules accepted: Orders

## 2023-02-07 NOTE — Therapy (Signed)
OUTPATIENT PHYSICAL THERAPY TREATMENT  Patient Name: Barbara Ballard MRN: [id number] DOB:06/17/1946, 77 y.o., female Today's Date: 02/07/2023   PCP: [facility], [place] PROVIDER: Hilton Sinclair, PA-C   END OF SESSION:   PT End of Session - 02/07/23 1026     Visit Number 15    Number of Visits 22    Date for PT Re-Evaluation 03/14/23    Progress Note Due on Visit 20    PT Start Time 1027    PT Stop Time 1110    PT Time Calculation (min) 43 min    Equipment Utilized During Treatment Gait belt    Activity Tolerance Patient tolerated treatment well    Behavior During Therapy WFL for tasks assessed/performed              Past Medical History:  Diagnosis Date   Depression    High cholesterol    Hypertension    Restless leg    Past Surgical History:  Procedure Laterality Date   BUNIONECTOMY     CHOLECYSTECTOMY     Patient Active Problem List   Diagnosis Date Noted   Depression 03/21/2022   Essential hypertension 03/21/2022   Hyperlipidemia 03/21/2022   Restless leg syndrome 03/21/2022   Preoperative evaluation to rule out surgical contraindication 07/17/2021   Dermatochalasis of upper and lower eyelids of both eyes 02/23/2021   Diplopia 02/23/2021   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   Nuclear sclerotic cataract of both eyes 02/23/2021   Bilateral sensorineural hearing loss 09/09/2020   Orthostatic hypotension 07/15/2020   Grief 12/23/2019   Hypokalemia 12/23/2019   Osteopenia of spine 08/04/2019   Moderate episode of recurrent major depressive disorder ([place]) 08/12/2018   Dental decay 03/19/2018   Closed displaced fracture of distal phalanx of left middle finger with routine healing 12/24/2016   Fracture of lumbar spine ([place]) 04/11/2014   AK (actinic keratosis) 11/04/2012   Neuropathy 05/05/2012   Personal history of other malignant neoplasm of skin 04/22/2012    REFERRING DIAG:  R29.6  (ICD-10-CM) - Repeated falls    THERAPY DIAG:  History of falling  Difficulty in walking, not elsewhere classified  Muscle weakness (generalized)  Imbalance  Rationale for Evaluation and Treatment Rehabilitation  PERTINENT HISTORY: Patient is a 77 year old female referred to outpatient PT for frequent falls following hospital discharge (pt was admitted 10/16/22 due to pneumonia and had positive urinalysis for infection). Patient currently reports that her condition has improved, but she still has not gotten her voice back. Pt denies chest pain or SOB with getting around. Pt reports difficulty with balance and fear of falling. She reports unsteadiness on her feet that is worse after prolonged sitting. Pt reports LOB with bending down e.g. doing gardening and trying to get up. She reports no major change since hospitalization with this. Pt reports neuropathy affecting her feet.   Pain: No Numbness/Tingling: Yes, numbness in feet and paresthesias in feet/toes  Focal Weakness: No Recent changes in overall health/medication: Yes, hospitalization in November  Prior history of physical therapy for balance:  Yes, history of PT for balance in 2916  Falls: Has patient fallen in last 6 months? Yes, Number of falls: 3-4 times, non-injurious falls  Directional pattern for falls: Yes, forward  Imaging: No  Prior level of function: Independent Occupational demands: N/A Hobbies: walking, gardening    Red flags (bowel/bladder changes, saddle paresthesia, personal history of cancer, h/o spinal tumors, h/o compression fx, h/o abdominal aneurysm,  abdominal pain, chills/fever, night sweats, nausea, vomiting, unrelenting pain): Positive for history of skin cancer only on patient's chart   Weight Bearing Restrictions: No   Living Environment Lives with: lives alone, her son lives nearby; daughter lives in [place]  Lives in: House/apartment,  home is 2 levels. She has spiral staircase to get to 2nd floor  with handrail on one side. Her bedroom/bathroom is downstairs.  2 steps to get into home at back entrance, handrail on both sides. Has following equipment at home: Single point cane, shower chair, Grab bars, and bars for toilet transfer      Patient Goals: "Strengthen my balance", more endurance     PRECAUTIONS: Fall history  SUBJECTIVE:                                                                                                                                                                                      SUBJECTIVE STATEMENT:  Pt reports no major updates or new concerns this AM. Patient reports no major LOB or near-falls since her visit Tuesday.     OBJECTIVE: (objective measures completed at initial evaluation unless otherwise dated)   Patient Surveys  FOTO: 90 ,predicted improvement to 70 ABC: 36.9%     GAIT: Distance walked: 150 Assistive device utilized: None Level of assistance: SBA Comments: Decreased arm swing and truncal rigidity, mild forward head and kyphotic posture maintained throughout gait cycle. Pt demonstrates decreased step length and increased cadence. Pt has sound heel strike bilaterally. No pelvic asymmetry or pelvic drop.      Posture: FHRS posture, increased thoracic kyphosis       LE MMT:   MMT (out of 5) Right 11/27/2022 Left 11/27/2022  Hip flexion 4- 4-  Hip extension      Hip abduction (seated) 4 4  Hip adduction (seated)  5 5  Hip internal rotation      Hip external rotation      Knee flexion 5 4+  Knee extension 4+ 5-  Ankle dorsiflexion 4+ 4+  Ankle plantarflexion      Ankle inversion      Ankle eversion      (* = pain; Blank rows = not tested)     Sensation Loss of light touch sensation in bilateral digits of feet      Reflexes Deferred     Cranial Nerves Deferred     Coordination/Cerebellar Finger to Nose: WNL Heel to Shin: WNL Rapid alternating movements: WNL Finger Opposition: WNL Pronator Drift:  Negative     FUNCTIONAL OUTCOME MEASURES -INITIAL EVALUATION    Results Comments  BERG 47/56 Fall risk, in need of intervention  DGI 15/24  TUG 13.63 seconds Fall risk, in need of intervention    5TSTS 18 seconds Fall risk, in need of intervention    6 Minute Walk Test 980 ft    (Blank rows = not tested)          TODAY'S TREATMENT      Neuromuscular Re-education - for improved sensory integration, static and dynamic postural control, equilibrium and non-equilibrium coordination as needed for negotiating home and community environment and stepping over obstacles  (All drills performed with careful CGA)    In // bars:  Forward and retro stepping; 5x D/B High knees; 5x D/B Consecutive obstacle negotiations; (3) 6-inch hurdles, Airex pad, 6-inch step, Airex pad; x5 D/B with step-to pattern in hallway Resisted forward stepping with controlled retro step to train tendency for posterior bias, with black tubing in // bars; x5 Toe tapping, 6-inch step; alternating R/L, 1x10  -Toe tapping 6-inch step + Airex pad; 2x10  Blue star 8-way multidirectional stepping; 3x8  ea dir with bilat LE, intermittent cueing for technique Standing feet together on Airex; 2x30 sec Stride stance with perturbations, RLE in back and LLE in back; x 1 minute each   *next visit* Cone reaching, pt reaching outside of BOS to challenge static postural control; 3x7 cones with L and R hand   *not today* Stride stance ; 3x30 sec in each position (RLE in back and LLE in back) Sidestepping length // bars; 5x D/B Hurdle step over 5-lb Dbell; 2x10 with each LE  -heavy cueing for heel to toe stepping; cued for decreased upper limb support   Therapeutic Exercise - improved strength as needed to improve performance of CKC activities/functional movements and as needed for power production to prevent fall during episode of large postural perturbation    NuStep; 5 minutes; Level 4, seat at 8, arms at  9   -subjective information gathered during this time Sit to stand with Airex under feet; 2x10   *not today*  3-way hip in standing; BUE support in parallel bars, with 4-lb ankle weights; 2x10 each direction bilaterally    PATIENT EDUCATION:  Education details: HEP review Person educated: Patient Education method: Explanation and Handouts Education comprehension: verbalized understanding     HOME EXERCISE PROGRAM: Access Code: [email or web address] URL: [email or web address] Date: 12/04/2022 Prepared by: Valentina Gu  Exercises - Sit to Stand with Counter Support  - 2 x daily - 7 x weekly - 2 sets - 10 reps - Heel Toe Raises with Counter Support  - 2 x daily - 7 x weekly - 2 sets - 10 reps - Standing March with Counter Support  - 2 x daily - 7 x weekly - 2 sets - 10 reps - Standing Hip Abduction with Unilateral Counter Support  - 2 x daily - 7 x weekly - 2 sets - 10 reps     ASSESSMENT:   CLINICAL IMPRESSION: We added exercise to challenge postural control following performance of sit to stand with pt having 2 episodes of mild posterior LOB. She demonstrates improving ability to perform obstacle negotiation and multi-directional stepping. Pt needs further work on static and dynamic balance and as well as reactive balance to prevent fall during episode of large postural perturbation. Pt has remaining deficits in postural control, lower extremity strength, equilibrium lower limb coordination, and impaired proprioception/kinesthetic sense of lower limbs associated with neuropathy. Patient will benefit from continued skilled therapeutic intervention to address the above deficits as needed for improved function and QoL.     REHAB  POTENTIAL: Good   CLINICAL DECISION MAKING: Evolving/moderate complexity   EVALUATION COMPLEXITY: High     GOALS: Goals reviewed with patient? No   SHORT TERM GOALS: Target date: 12/18/2022   Pt will be independent with HEP in order to improve  strength and balance in order to decrease fall risk and improve function at home. Baseline: 11/27/22: Baseline HEP initiated Goal status: ACHIEVED     LONG TERM GOALS: Target date: 01/22/2023   Pt will increase FOTO to at least 70 to demonstrate significant improvement in function at home related to balance  Baseline: 11/27/22: 64.      12/27/22: 50.     02/05/23: 69/70 Goal status: IN PROGRESS   2.  Pt will improve BERG by at least 3 points in order to demonstrate clinically significant improvement in balance.   Baseline: 11/27/22: BERG 47/56.     12/27/22: BERG 47/56.     02/05/23:  BERG 48/56 Goal status: ON-GOING   3.  Pt will improve ABC by at least 13% in order to demonstrate clinically significant improvement in balance confidence.      Baseline: 11/27/22: 36.9%  12/27/22 : 45.6%.    02/05/23: 69.4% Goal status: ACHIEVED   4. Pt will decrease 5TSTS by at least 3 seconds in order to demonstrate clinically significant improvement in LE strength      Baseline: 11/27/22: 18 sec  12/27/22: 17.12 sec.  02/05/23: 10.4 Goal status: ACHIEVED    5. Pt will improve DGI by at least 3 points in order to demonstrate clinically significant improvement in balance and decreased risk for falls.     Baseline: 11/27/22: Deferred to visit # 2.      11/29/22: 15/24     12/27/22:12/24.     02/05/23: 12/24 Goal status: NOT MET   6. Pt will decrease TUG to below 12 seconds/decrease in order to demonstrate decreased fall risk.  Baseline: 11/27/22: 13.6 sec     12/27/22: 11.5 sec Goal status: ACHIEVED   7. Pt will increase 6MWT by at least 52m (187ft) in order to demonstrate clinically significant improvement in cardiopulmonary endurance and community ambulation  Baseline: 11/27/22: Deferred to visit # 2.   11/29/22: 980 feet.  12/27/22:Deferred  01/01/23: 840 ft.     02/05/23: 1050 ft Goal status: ACHIEVED      PLAN: PT FREQUENCY: 2x/week   PT DURATION: 8 weeks   PLANNED INTERVENTIONS: Therapeutic exercises, Therapeutic activity,  Neuromuscular re-education, Balance training, Gait training, Patient/Family education, Electrical stimulation   PLAN FOR NEXT SESSION: Continue with drills re-training obstacle negotiation, sit to stand, and static postural control. Integrate activities requiring acceleration/deceleration during ambulation, dual tasking during gait requiring head turns.     Valentina Gu, PT, DPT [phone] 02/07/2023 10:27 AM

## 2023-02-12 ENCOUNTER — Ambulatory Visit: Payer: Medicare HMO | Admitting: Physical Therapy

## 2023-02-12 DIAGNOSIS — R2689 Other abnormalities of gait and mobility: Secondary | ICD-10-CM

## 2023-02-12 DIAGNOSIS — Z9181 History of falling: Secondary | ICD-10-CM | POA: Diagnosis not present

## 2023-02-12 DIAGNOSIS — R262 Difficulty in walking, not elsewhere classified: Secondary | ICD-10-CM

## 2023-02-12 DIAGNOSIS — M6281 Muscle weakness (generalized): Secondary | ICD-10-CM

## 2023-02-12 NOTE — Therapy (Signed)
OUTPATIENT PHYSICAL THERAPY TREATMENT  Patient Name: TOREE SANTOR MRN: [id number] DOB:1946-10-19, 77 y.o., female Today's Date: 02/12/2023   PCP: [facility], [place]  REFERRING PROVIDER: Hilton Sinclair, PA-C   END OF SESSION:   PT End of Session - 02/12/23 1032     Visit Number 16    Number of Visits 22    Date for PT Re-Evaluation 03/14/23    Progress Note Due on Visit 20    PT Start Time 1033    PT Stop Time 1114    PT Time Calculation (min) 41 min    Equipment Utilized During Treatment Gait belt    Activity Tolerance Patient tolerated treatment well    Behavior During Therapy WFL for tasks assessed/performed               Past Medical History:  Diagnosis Date   Depression    High cholesterol    Hypertension    Restless leg    Past Surgical History:  Procedure Laterality Date   BUNIONECTOMY     CHOLECYSTECTOMY     Patient Active Problem List   Diagnosis Date Noted   Depression 03/21/2022   Essential hypertension 03/21/2022   Hyperlipidemia 03/21/2022   Restless leg syndrome 03/21/2022   Preoperative evaluation to rule out surgical contraindication 07/17/2021   Dermatochalasis of upper and lower eyelids of both eyes 02/23/2021   Diplopia 02/23/2021   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   Nuclear sclerotic cataract of both eyes 02/23/2021   Bilateral sensorineural hearing loss 09/09/2020   Orthostatic hypotension 07/15/2020   Grief 12/23/2019   Hypokalemia 12/23/2019   Osteopenia of spine 08/04/2019   Moderate episode of recurrent major depressive disorder ([place]) 08/12/2018   Dental decay 03/19/2018   Closed displaced fracture of distal phalanx of left middle finger with routine healing 12/24/2016   Fracture of lumbar spine ([place]) 04/11/2014   AK (actinic keratosis) 11/04/2012   Neuropathy 05/05/2012   Personal history of other malignant neoplasm of skin 04/22/2012    REFERRING DIAG:  R29.6  (ICD-10-CM) - Repeated falls    THERAPY DIAG:  History of falling  Difficulty in walking, not elsewhere classified  Muscle weakness (generalized)  Imbalance  Rationale for Evaluation and Treatment Rehabilitation  PERTINENT HISTORY: Patient is a 77 year old female referred to outpatient PT for frequent falls following hospital discharge (pt was admitted 10/16/22 due to pneumonia and had positive urinalysis for infection). Patient currently reports that her condition has improved, but she still has not gotten her voice back. Pt denies chest pain or SOB with getting around. Pt reports difficulty with balance and fear of falling. She reports unsteadiness on her feet that is worse after prolonged sitting. Pt reports LOB with bending down e.g. doing gardening and trying to get up. She reports no major change since hospitalization with this. Pt reports neuropathy affecting her feet.   Pain: No Numbness/Tingling: Yes, numbness in feet and paresthesias in feet/toes  Focal Weakness: No Recent changes in overall health/medication: Yes, hospitalization in November  Prior history of physical therapy for balance:  Yes, history of PT for balance in [facility]16  Falls: Has patient fallen in last 6 months? Yes, Number of falls: 3-4 times, non-injurious falls  Directional pattern for falls: Yes, forward  Imaging: No  Prior level of function: Independent Occupational demands: N/A Hobbies: walking, gardening    Red flags (bowel/bladder changes, saddle paresthesia, personal history of cancer, h/o spinal tumors, h/o compression fx, h/o abdominal  aneurysm, abdominal pain, chills/fever, night sweats, nausea, vomiting, unrelenting pain): Positive for history of skin cancer only on patient's chart   Weight Bearing Restrictions: No   Living Environment Lives with: lives alone, her son lives nearby; daughter lives in [place]  Lives in: House/apartment,  home is 2 levels. She has spiral staircase to get to 2nd floor  with handrail on one side. Her bedroom/bathroom is downstairs.  2 steps to get into home at back entrance, handrail on both sides. Has following equipment at home: Single point cane, shower chair, Grab bars, and bars for toilet transfer      Patient Goals: "Strengthen my balance", more endurance     PRECAUTIONS: Fall history  SUBJECTIVE:                                                                                                                                                                                      SUBJECTIVE STATEMENT:  Pt reports no new complaints or concerns at arrival to PT. She reports no recent falls/near-falls. Pt reports tolerating last visit well.     OBJECTIVE: (objective measures completed at initial evaluation unless otherwise dated)   Patient Surveys  FOTO: 53 ,predicted improvement to 70 ABC: 36.9%     GAIT: Distance walked: 150 Assistive device utilized: None Level of assistance: SBA Comments: Decreased arm swing and truncal rigidity, mild forward head and kyphotic posture maintained throughout gait cycle. Pt demonstrates decreased step length and increased cadence. Pt has sound heel strike bilaterally. No pelvic asymmetry or pelvic drop.      Posture: FHRS posture, increased thoracic kyphosis       LE MMT:   MMT (out of 5) Right 11/27/2022 Left 11/27/2022  Hip flexion 4- 4-  Hip extension      Hip abduction (seated) 4 4  Hip adduction (seated)  5 5  Hip internal rotation      Hip external rotation      Knee flexion 5 4+  Knee extension 4+ 5-  Ankle dorsiflexion 4+ 4+  Ankle plantarflexion      Ankle inversion      Ankle eversion      (* = pain; Blank rows = not tested)     Sensation Loss of light touch sensation in bilateral digits of feet      Reflexes Deferred     Cranial Nerves Deferred     Coordination/Cerebellar Finger to Nose: WNL Heel to Shin: WNL Rapid alternating movements: WNL Finger Opposition:  WNL Pronator Drift: Negative     FUNCTIONAL OUTCOME MEASURES -INITIAL EVALUATION    Results Comments  BERG 47/56 Fall risk, in need of intervention  DGI 15/24    TUG 13.63 seconds Fall risk, in need of intervention    5TSTS 18 seconds Fall risk, in need of intervention    6 Minute Walk Test 980 ft    (Blank rows = not tested)          TODAY'S TREATMENT      Neuromuscular Re-education - for improved sensory integration, static and dynamic postural control, equilibrium and non-equilibrium coordination as needed for negotiating home and community environment and stepping over obstacles  (All drills performed with careful CGA)    In // bars:  Forward and retro stepping; 5x D/B High knees; 5x D/B  Consecutive obstacle negotiations in hallway; (3) 6-inch hurdles, Airex pad, 6-inch step, Airex pad; x5 D/B with step-to pattern in hallway  Resisted forward stepping with controlled retro step to train tendency for posterior bias, with black tubing in // bars; x5  Toe tapping 6-inch step + Airex pad; 2x10  Blue star 8-way multidirectional stepping; 3x8  ea dir with bilat LE, intermittent cueing for technique  Cone reaching, pt reaching outside of BOS to challenge static postural control; 2x10 cones with L and R hand    *not today* Standing feet together on Airex; 2x30 sec Stride stance with perturbations, RLE in back and LLE in back; x 1 minute each Stride stance ; 3x30 sec in each position (RLE in back and LLE in back) Sidestepping length // bars; 5x D/B Hurdle step over 5-lb Dbell; 2x10 with each LE  -heavy cueing for heel to toe stepping; cued for decreased upper limb support   Therapeutic Exercise - improved strength as needed to improve performance of CKC activities/functional movements and as needed for power production to prevent fall during episode of large postural perturbation   Ambulate laps around gym; x 3 laps at beginning of session   Sit to stand with  Airex under feet; 2x10   *not today* NuStep; 5 minutes; Level 4, seat at 8, arms at 9   -subjective information gathered during this time  3-way hip in standing; BUE support in parallel bars, with 4-lb ankle weights; 2x10 each direction bilaterally    PATIENT EDUCATION:  Education details: HEP review Person educated: Patient Education method: Explanation and Handouts Education comprehension: verbalized understanding     HOME EXERCISE PROGRAM: Access Code: [email or web address] URL: [email or web address] Date: 12/04/2022 Prepared by: Valentina Gu  Exercises - Sit to Stand with Counter Support  - 2 x daily - 7 x weekly - 2 sets - 10 reps - Heel Toe Raises with Counter Support  - 2 x daily - 7 x weekly - 2 sets - 10 reps - Standing March with Counter Support  - 2 x daily - 7 x weekly - 2 sets - 10 reps - Standing Hip Abduction with Unilateral Counter Support  - 2 x daily - 7 x weekly - 2 sets - 10 reps     ASSESSMENT:   CLINICAL IMPRESSION: We are continuing with drills to challenge static and dynamic balance as well as specific obstacle negotiation work to improve patient's ability to negotiate curbs/steps/obstacles in home and community. Pt is still significantly challenged with tasks requiring narrow BOS. She does tend to overcorrect with postural righting reactions and lose balance posteriorly. Pt has no posterior LOB with sit to stand on Airex today. Pt has remaining deficits in postural control, lower extremity strength, equilibrium lower limb coordination, and impaired proprioception/kinesthetic sense of lower limbs associated with neuropathy. Patient will benefit from continued skilled  therapeutic intervention to address the above deficits as needed for improved function and QoL.     REHAB POTENTIAL: Good   CLINICAL DECISION MAKING: Evolving/moderate complexity   EVALUATION COMPLEXITY: High     GOALS: Goals reviewed with patient? No   SHORT TERM GOALS: Target  date: 12/18/2022   Pt will be independent with HEP in order to improve strength and balance in order to decrease fall risk and improve function at home. Baseline: 11/27/22: Baseline HEP initiated Goal status: ACHIEVED     LONG TERM GOALS: Target date: 01/22/2023   Pt will increase FOTO to at least 70 to demonstrate significant improvement in function at home related to balance  Baseline: 11/27/22: 64.      12/27/22: 50.     02/05/23: 69/70 Goal status: IN PROGRESS   2.  Pt will improve BERG by at least 3 points in order to demonstrate clinically significant improvement in balance.   Baseline: 11/27/22: BERG 47/56.     12/27/22: BERG 47/56.     02/05/23:  BERG 48/56 Goal status: ON-GOING   3.  Pt will improve ABC by at least 13% in order to demonstrate clinically significant improvement in balance confidence.      Baseline: 11/27/22: 36.9%  12/27/22 : 45.6%.    02/05/23: 69.4% Goal status: ACHIEVED   4. Pt will decrease 5TSTS by at least 3 seconds in order to demonstrate clinically significant improvement in LE strength      Baseline: 11/27/22: 18 sec  12/27/22: 17.12 sec.  02/05/23: 10.4 Goal status: ACHIEVED    5. Pt will improve DGI by at least 3 points in order to demonstrate clinically significant improvement in balance and decreased risk for falls.     Baseline: 11/27/22: Deferred to visit # 2.      11/29/22: 15/24     12/27/22:12/24.     02/05/23: 12/24 Goal status: NOT MET   6. Pt will decrease TUG to below 12 seconds/decrease in order to demonstrate decreased fall risk.  Baseline: 11/27/22: 13.6 sec     12/27/22: 11.5 sec Goal status: ACHIEVED   7. Pt will increase 6MWT by at least 38m (15ft) in order to demonstrate clinically significant improvement in cardiopulmonary endurance and community ambulation  Baseline: 11/27/22: Deferred to visit # 2.   11/29/22: 980 feet.  12/27/22:Deferred  01/01/23: 840 ft.     02/05/23: 1050 ft Goal status: ACHIEVED      PLAN: PT FREQUENCY: 2x/week   PT DURATION: 8 weeks    PLANNED INTERVENTIONS: Therapeutic exercises, Therapeutic activity, Neuromuscular re-education, Balance training, Gait training, Patient/Family education, Electrical stimulation   PLAN FOR NEXT SESSION: Continue with drills re-training obstacle negotiation, sit to stand, and static postural control. Integrate activities requiring acceleration/deceleration during ambulation, dual tasking during gait requiring head turns.     Valentina Gu, PT, DPT [phone] 02/12/2023 10:33 AM

## 2023-02-14 ENCOUNTER — Encounter: Payer: Self-pay | Admitting: Physical Therapy

## 2023-02-14 ENCOUNTER — Ambulatory Visit: Payer: Medicare HMO | Admitting: Physical Therapy

## 2023-02-14 DIAGNOSIS — Z9181 History of falling: Secondary | ICD-10-CM

## 2023-02-14 DIAGNOSIS — M6281 Muscle weakness (generalized): Secondary | ICD-10-CM

## 2023-02-14 DIAGNOSIS — R262 Difficulty in walking, not elsewhere classified: Secondary | ICD-10-CM

## 2023-02-14 NOTE — Therapy (Signed)
OUTPATIENT PHYSICAL THERAPY TREATMENT  Patient Name: Barbara Ballard MRN: [id number] DOB:23-Nov-1946, 77 y.o., female Today's Date: 02/14/2023   PCP: [facility], [place] PROVIDER: Hilton Sinclair, PA-C   END OF SESSION:   PT End of Session - 02/14/23 1035     Visit Number 17    Number of Visits 22    Date for PT Re-Evaluation 03/14/23    Progress Note Due on Visit 20    PT Start Time 1033    PT Stop Time 1114    PT Time Calculation (min) 41 min    Equipment Utilized During Treatment Gait belt    Activity Tolerance Patient tolerated treatment well    Behavior During Therapy WFL for tasks assessed/performed              Past Medical History:  Diagnosis Date   Depression    High cholesterol    Hypertension    Restless leg    Past Surgical History:  Procedure Laterality Date   BUNIONECTOMY     CHOLECYSTECTOMY     Patient Active Problem List   Diagnosis Date Noted   Depression 03/21/2022   Essential hypertension 03/21/2022   Hyperlipidemia 03/21/2022   Restless leg syndrome 03/21/2022   Preoperative evaluation to rule out surgical contraindication 07/17/2021   Dermatochalasis of upper and lower eyelids of both eyes 02/23/2021   Diplopia 02/23/2021   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   Nuclear sclerotic cataract of both eyes 02/23/2021   Bilateral sensorineural hearing loss 09/09/2020   Orthostatic hypotension 07/15/2020   Grief 12/23/2019   Hypokalemia 12/23/2019   Osteopenia of spine 08/04/2019   Moderate episode of recurrent major depressive disorder ([place]) 08/12/2018   Dental decay 03/19/2018   Closed displaced fracture of distal phalanx of left middle finger with routine healing 12/24/2016   Fracture of lumbar spine ([place]) 04/11/2014   AK (actinic keratosis) 11/04/2012   Neuropathy 05/05/2012   Personal history of other malignant neoplasm of skin 04/22/2012    REFERRING DIAG:  R29.6  (ICD-10-CM) - Repeated falls    THERAPY DIAG:  History of falling  Difficulty in walking, not elsewhere classified  Muscle weakness (generalized)  Rationale for Evaluation and Treatment Rehabilitation  PERTINENT HISTORY: Patient is a 77 year old female referred to outpatient PT for frequent falls following hospital discharge (pt was admitted 10/16/22 due to pneumonia and had positive urinalysis for infection). Patient currently reports that her condition has improved, but she still has not gotten her voice back. Pt denies chest pain or SOB with getting around. Pt reports difficulty with balance and fear of falling. She reports unsteadiness on her feet that is worse after prolonged sitting. Pt reports LOB with bending down e.g. doing gardening and trying to get up. She reports no major change since hospitalization with this. Pt reports neuropathy affecting her feet.   Pain: No Numbness/Tingling: Yes, numbness in feet and paresthesias in feet/toes  Focal Weakness: No Recent changes in overall health/medication: Yes, hospitalization in November  Prior history of physical therapy for balance:  Yes, history of PT for balance in 2916  Falls: Has patient fallen in last 6 months? Yes, Number of falls: 3-4 times, non-injurious falls  Directional pattern for falls: Yes, forward  Imaging: No  Prior level of function: Independent Occupational demands: N/A Hobbies: walking, gardening    Red flags (bowel/bladder changes, saddle paresthesia, personal history of cancer, h/o spinal tumors, h/o compression fx, h/o abdominal aneurysm, abdominal pain,  chills/fever, night sweats, nausea, vomiting, unrelenting pain): Positive for history of skin cancer only on patient's chart   Weight Bearing Restrictions: No   Living Environment Lives with: lives alone, her son lives nearby; daughter lives in [place]  Lives in: House/apartment,  home is 2 levels. She has spiral staircase to get to 2nd floor with handrail  on one side. Her bedroom/bathroom is downstairs.  2 steps to get into home at back entrance, handrail on both sides. Has following equipment at home: Single point cane, shower chair, Grab bars, and bars for toilet transfer      Patient Goals: "Strengthen my balance", more endurance     PRECAUTIONS: Fall history  SUBJECTIVE:                                                                                                                                                                                      SUBJECTIVE STATEMENT:  Pt reports no recent near-falls or falls. Patient reports tolerating last visit well. No other updates this AM.     OBJECTIVE: (objective measures completed at initial evaluation unless otherwise dated)   Patient Surveys (initial eval) FOTO: 64 ,predicted improvement to 70 ABC: 36.9%     GAIT: Distance walked: 150 Assistive device utilized: None Level of assistance: SBA Comments: Decreased arm swing and truncal rigidity, mild forward head and kyphotic posture maintained throughout gait cycle. Pt demonstrates decreased step length and increased cadence. Pt has sound heel strike bilaterally. No pelvic asymmetry or pelvic drop.      Posture: FHRS posture, increased thoracic kyphosis       LE MMT:   MMT (out of 5) Right 11/27/2022 Left 11/27/2022  Hip flexion 4- 4-  Hip extension      Hip abduction (seated) 4 4  Hip adduction (seated)  5 5  Hip internal rotation      Hip external rotation      Knee flexion 5 4+  Knee extension 4+ 5-  Ankle dorsiflexion 4+ 4+  Ankle plantarflexion      Ankle inversion      Ankle eversion      (* = pain; Blank rows = not tested)     Sensation Loss of light touch sensation in bilateral digits of feet      Reflexes Deferred     Cranial Nerves Deferred     Coordination/Cerebellar Finger to Nose: WNL Heel to Shin: WNL Rapid alternating movements: WNL Finger Opposition: WNL Pronator Drift: Negative      FUNCTIONAL OUTCOME MEASURES -INITIAL EVALUATION    Results Comments  BERG 47/56 Fall risk, in need of intervention  DGI 15/24    TUG  13.63 seconds Fall risk, in need of intervention    5TSTS 18 seconds Fall risk, in need of intervention    6 Minute Walk Test 980 ft    (Blank rows = not tested)          TODAY'S TREATMENT      Neuromuscular Re-education - for improved sensory integration, static and dynamic postural control, equilibrium and non-equilibrium coordination as needed for negotiating home and community environment and stepping over obstacles  (All drills performed with careful CGA)    In // bars:   High knees; 2x D/B High knee with opposite knee tap; 2x D/B  Resisted forward stepping with controlled retro step to train tendency for posterior bias, with black tubing in // bars; x5  Toe tapping 6-inch step + Airex pad; 2x10  Gait with ball toss for dual task; x3 D/B hallway  Blue star 8-way multidirectional stepping; 3x8  ea dir with bilat LE, intermittent cueing for technique  Cone reaching, pt reaching outside of BOS to challenge static postural control; 2x10 cones with L and R hand  Consecutive obstacle negotiations in hallway; (3) 6-inch hurdles, Airex pad, 6-inch step, Airex pad; x5 D/B with step-to pattern in hallway   *not today* Forward and retro stepping; 5x D/B Standing feet together on Airex; 2x30 sec Stride stance with perturbations, RLE in back and LLE in back; x 1 minute each Stride stance ; 3x30 sec in each position (RLE in back and LLE in back) Sidestepping length // bars; 5x D/B Hurdle step over 5-lb Dbell; 2x10 with each LE  -heavy cueing for heel to toe stepping; cued for decreased upper limb support   Therapeutic Exercise - improved strength as needed to improve performance of CKC activities/functional movements and as needed for power production to prevent fall during episode of large postural perturbation   Ambulate laps around  gym; x 3 laps at beginning of session   Sit to stand with Airex under feet; 2x10   *not today* NuStep; 5 minutes; Level 4, seat at 8, arms at 9   -subjective information gathered during this time  3-way hip in standing; BUE support in parallel bars, with 4-lb ankle weights; 2x10 each direction bilaterally    PATIENT EDUCATION:  Education details: HEP review Person educated: Patient Education method: Explanation and Handouts Education comprehension: verbalized understanding     HOME EXERCISE PROGRAM: Access Code: [email or web address] URL: [email or web address] Date: 12/04/2022 Prepared by: Valentina Gu  Exercises - Sit to Stand with Counter Support  - 2 x daily - 7 x weekly - 2 sets - 10 reps - Heel Toe Raises with Counter Support  - 2 x daily - 7 x weekly - 2 sets - 10 reps - Standing March with Counter Support  - 2 x daily - 7 x weekly - 2 sets - 10 reps - Standing Hip Abduction with Unilateral Counter Support  - 2 x daily - 7 x weekly - 2 sets - 10 reps     ASSESSMENT:   CLINICAL IMPRESSION: We are continuing with drills to challenge static and dynamic balance as well as specific obstacle negotiation work to improve patient's ability to negotiate curbs/steps/obstacles in home and community. Pt is still significantly challenged with tasks requiring narrow BOS. She does tend to overcorrect with postural righting reactions and lose balance posteriorly. Pt has no posterior LOB with sit to stand on Airex today. Pt has remaining deficits in postural control, lower extremity strength, equilibrium lower limb coordination, and impaired proprioception/kinesthetic  sense of lower limbs associated with neuropathy. Patient will benefit from continued skilled therapeutic intervention to address the above deficits as needed for improved function and QoL.     REHAB POTENTIAL: Good   CLINICAL DECISION MAKING: Evolving/moderate complexity   EVALUATION COMPLEXITY: High      GOALS: Goals reviewed with patient? No   SHORT TERM GOALS: Target date: 12/18/2022   Pt will be independent with HEP in order to improve strength and balance in order to decrease fall risk and improve function at home. Baseline: 11/27/22: Baseline HEP initiated Goal status: ACHIEVED     LONG TERM GOALS: Target date: 01/22/2023   Pt will increase FOTO to at least 70 to demonstrate significant improvement in function at home related to balance  Baseline: 11/27/22: 64.      12/27/22: 50.     02/05/23: 69/70 Goal status: IN PROGRESS   2.  Pt will improve BERG by at least 3 points in order to demonstrate clinically significant improvement in balance.   Baseline: 11/27/22: BERG 47/56.     12/27/22: BERG 47/56.     02/05/23:  BERG 48/56 Goal status: ON-GOING   3.  Pt will improve ABC by at least 13% in order to demonstrate clinically significant improvement in balance confidence.      Baseline: 11/27/22: 36.9%  12/27/22 : 45.6%.    02/05/23: 69.4% Goal status: ACHIEVED   4. Pt will decrease 5TSTS by at least 3 seconds in order to demonstrate clinically significant improvement in LE strength      Baseline: 11/27/22: 18 sec  12/27/22: 17.12 sec.  02/05/23: 10.4 Goal status: ACHIEVED    5. Pt will improve DGI by at least 3 points in order to demonstrate clinically significant improvement in balance and decreased risk for falls.     Baseline: 11/27/22: Deferred to visit # 2.      11/29/22: 15/24     12/27/22:12/24.     02/05/23: 12/24 Goal status: NOT MET   6. Pt will decrease TUG to below 12 seconds/decrease in order to demonstrate decreased fall risk.  Baseline: 11/27/22: 13.6 sec     12/27/22: 11.5 sec Goal status: ACHIEVED   7. Pt will increase 6MWT by at least 63m (150ft) in order to demonstrate clinically significant improvement in cardiopulmonary endurance and community ambulation  Baseline: 11/27/22: Deferred to visit # 2.   11/29/22: 980 feet.  12/27/22:Deferred  01/01/23: 840 ft.     02/05/23: 1050 ft Goal status:  ACHIEVED      PLAN: PT FREQUENCY: 2x/week   PT DURATION: 8 weeks   PLANNED INTERVENTIONS: Therapeutic exercises, Therapeutic activity, Neuromuscular re-education, Balance training, Gait training, Patient/Family education, Electrical stimulation   PLAN FOR NEXT SESSION: Continue with drills re-training obstacle negotiation, sit to stand, and static postural control. Integrate activities requiring acceleration/deceleration during ambulation, dual tasking during gait requiring head turns.    THIS NOTE IS INCOMPLETE, PLEASE DO NOT REFERENCE FOR INFORMATION   Valentina Gu, PT, DPT [phone] 02/14/2023 10:35 AM

## 2023-02-19 ENCOUNTER — Ambulatory Visit: Payer: Medicare HMO | Admitting: Physical Therapy

## 2023-02-19 DIAGNOSIS — R2689 Other abnormalities of gait and mobility: Secondary | ICD-10-CM

## 2023-02-19 DIAGNOSIS — M6281 Muscle weakness (generalized): Secondary | ICD-10-CM

## 2023-02-19 DIAGNOSIS — Z9181 History of falling: Secondary | ICD-10-CM | POA: Diagnosis not present

## 2023-02-19 DIAGNOSIS — R262 Difficulty in walking, not elsewhere classified: Secondary | ICD-10-CM

## 2023-02-19 NOTE — Therapy (Unsigned)
OUTPATIENT PHYSICAL THERAPY TREATMENT  Patient Name: Barbara Ballard MRN: [id number] DOB:1946-02-01, 77 y.o., female Today's Date: 02/19/2023   PCP: [facility], [place]  REFERRING PROVIDER: Hilton Sinclair, PA-C   END OF SESSION:      Past Medical History:  Diagnosis Date   Depression    High cholesterol    Hypertension    Restless leg    Past Surgical History:  Procedure Laterality Date   BUNIONECTOMY     CHOLECYSTECTOMY     Patient Active Problem List   Diagnosis Date Noted   Depression 03/21/2022   Essential hypertension 03/21/2022   Hyperlipidemia 03/21/2022   Restless leg syndrome 03/21/2022   Preoperative evaluation to rule out surgical contraindication 07/17/2021   Dermatochalasis of upper and lower eyelids of both eyes 02/23/2021   Diplopia 02/23/2021   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   Nuclear sclerotic cataract of both eyes 02/23/2021   Bilateral sensorineural hearing loss 09/09/2020   Orthostatic hypotension 07/15/2020   Grief 12/23/2019   Hypokalemia 12/23/2019   Osteopenia of spine 08/04/2019   Moderate episode of recurrent major depressive disorder ([place]) 08/12/2018   Dental decay 03/19/2018   Closed displaced fracture of distal phalanx of left middle finger with routine healing 12/24/2016   Fracture of lumbar spine ([place]) 04/11/2014   AK (actinic keratosis) 11/04/2012   Neuropathy 05/05/2012   Personal history of other malignant neoplasm of skin 04/22/2012    REFERRING DIAG:  R29.6 (ICD-10-CM) - Repeated falls    THERAPY DIAG:  No diagnosis found.  Rationale for Evaluation and Treatment Rehabilitation  PERTINENT HISTORY: Patient is a 77 year old female referred to outpatient PT for frequent falls following hospital discharge (pt was admitted 10/16/22 due to pneumonia and had positive urinalysis for infection). Patient currently reports that her condition has improved, but she still  has not gotten her voice back. Pt denies chest pain or SOB with getting around. Pt reports difficulty with balance and fear of falling. She reports unsteadiness on her feet that is worse after prolonged sitting. Pt reports LOB with bending down e.g. doing gardening and trying to get up. She reports no major change since hospitalization with this. Pt reports neuropathy affecting her feet.   Pain: No Numbness/Tingling: Yes, numbness in feet and paresthesias in feet/toes  Focal Weakness: No Recent changes in overall health/medication: Yes, hospitalization in November  Prior history of physical therapy for balance:  Yes, history of PT for balance in 2916  Falls: Has patient fallen in last 6 months? Yes, Number of falls: 3-4 times, non-injurious falls  Directional pattern for falls: Yes, forward  Imaging: No  Prior level of function: Independent Occupational demands: N/A Hobbies: walking, gardening    Red flags (bowel/bladder changes, saddle paresthesia, personal history of cancer, h/o spinal tumors, h/o compression fx, h/o abdominal aneurysm, abdominal pain, chills/fever, night sweats, nausea, vomiting, unrelenting pain): Positive for history of skin cancer only on patient's chart   Weight Bearing Restrictions: No   Living Environment Lives with: lives alone, her son lives nearby; daughter lives in [place]  Lives in: House/apartment,  home is 2 levels. She has spiral staircase to get to 2nd floor with handrail on one side. Her bedroom/bathroom is downstairs.  2 steps to get into home at back entrance, handrail on both sides. Has following equipment at home: Single point cane, shower chair, Grab bars, and bars for toilet transfer      Patient Goals: "Strengthen my balance", more endurance  PRECAUTIONS: Fall history  SUBJECTIVE:                                                                                                                                                                                       SUBJECTIVE STATEMENT:  Pt reports having fall from kneeling position in her garden when trying to get up from the ground. She reports no injury or significant pain afterward. Pt denies notable ecchymosis or erythema after her fall. Patient reports she tends to fall when getting down on her knees or when first getting up from seat.     OBJECTIVE: (objective measures completed at initial evaluation unless otherwise dated)   Patient Surveys (initial eval) FOTO: 64 ,predicted improvement to 70 ABC: 36.9%     GAIT: Distance walked: 150 Assistive device utilized: None Level of assistance: SBA Comments: Decreased arm swing and truncal rigidity, mild forward head and kyphotic posture maintained throughout gait cycle. Pt demonstrates decreased step length and increased cadence. Pt has sound heel strike bilaterally. No pelvic asymmetry or pelvic drop.      Posture: FHRS posture, increased thoracic kyphosis       LE MMT:   MMT (out of 5) Right 11/27/2022 Left 11/27/2022  Hip flexion 4- 4-  Hip extension      Hip abduction (seated) 4 4  Hip adduction (seated)  5 5  Hip internal rotation      Hip external rotation      Knee flexion 5 4+  Knee extension 4+ 5-  Ankle dorsiflexion 4+ 4+  Ankle plantarflexion      Ankle inversion      Ankle eversion      (* = pain; Blank rows = not tested)     Sensation Loss of light touch sensation in bilateral digits of feet      Reflexes Deferred     Cranial Nerves Deferred     Coordination/Cerebellar Finger to Nose: WNL Heel to Shin: WNL Rapid alternating movements: WNL Finger Opposition: WNL Pronator Drift: Negative     FUNCTIONAL OUTCOME MEASURES -INITIAL EVALUATION    Results Comments  BERG 47/56 Fall risk, in need of intervention  DGI 15/24    TUG 13.63 seconds Fall risk, in need of intervention    5TSTS 18 seconds Fall risk, in need of intervention    6 Minute Walk Test 980 ft    (Blank rows = not  tested)          TODAY'S TREATMENT      Neuromuscular Re-education - for improved sensory integration, static and dynamic postural control, equilibrium and non-equilibrium coordination as needed for negotiating home and community environment  and stepping over obstacles  (All drills performed with careful CGA)     High knees; 2x D/B blue agility ladder  Gait with ball toss for dual task; x3 D/B hallway   In // bars:  Resisted forward stepping with controlled retro step to train tendency for posterior bias, with black tubing in // bars; x5  Toe tapping 6-inch step + Airex pad; 2x10    Blue star 8-way multidirectional stepping; 3x8  ea dir with bilat LE, intermittent cueing for technique  Cone reaching, pt reaching outside of BOS to challenge static postural control; 2x10 cones with L and R hand  Consecutive obstacle negotiations in hallway; (3) 6-inch hurdles, Airex pad, 6-inch step, Airex pad; x5 D/B with step-to pattern in hallway   *not today* High knee with opposite knee tap; 2x D/B Forward and retro stepping; 5x D/B Standing feet together on Airex; 2x30 sec Stride stance with perturbations, RLE in back and LLE in back; x 1 minute each Stride stance ; 3x30 sec in each position (RLE in back and LLE in back) Sidestepping length // bars; 5x D/B Hurdle step over 5-lb Dbell; 2x10 with each LE  -heavy cueing for heel to toe stepping; cued for decreased upper limb support   Therapeutic Exercise - improved strength as needed to improve performance of CKC activities/functional movements and as needed for power production to prevent fall during episode of large postural perturbation  NuStep; 5 minutes; Level 4, seat at 8, arms at 9   -subjective information gathered during this time  Sit to stand with Airex under feet; 2x10  Floor transfer, mat on floor adjacent to low mat table; x 5  -patient exhibits   TRX reverse lunge, partial range; 1x10 with each LE  Minilunge  adjacent to treadmill armrest;   -safe alternative for HEP    *not today* Ambulate laps around gym; x 3 laps at beginning of session   3-way hip in standing; BUE support in parallel bars, with 4-lb ankle weights; 2x10 each direction bilaterally    PATIENT EDUCATION:  Education details: HEP review Person educated: Patient Education method: Explanation and Handouts Education comprehension: verbalized understanding     HOME EXERCISE PROGRAM: Access Code: [email or web address] URL: [email or web address] Date: 12/04/2022 Prepared by: Valentina Gu  Exercises - Sit to Stand with Counter Support  - 2 x daily - 7 x weekly - 2 sets - 10 reps - Heel Toe Raises with Counter Support  - 2 x daily - 7 x weekly - 2 sets - 10 reps - Standing March with Counter Support  - 2 x daily - 7 x weekly - 2 sets - 10 reps - Standing Hip Abduction with Unilateral Counter Support  - 2 x daily - 7 x weekly - 2 sets - 10 reps     ASSESSMENT:   CLINICAL IMPRESSION: Patient is able to add dual task gait and continue with uneven surface and obstacle negotiation with less incidence of posterior LOB. Patient is significantly challenged with drills requiring narrow BOS still, and will benefit from further intervention including balancing with modified BOS. Pt is making good progress at this time, but she does need further work on ability to re-gain postural control during posterior LOB and ability to complete stepping strategy to prevent fall. Pt has remaining deficits in postural control, lower extremity strength, equilibrium lower limb coordination, and impaired proprioception/kinesthetic sense of lower limbs associated with neuropathy. Patient will benefit from continued skilled therapeutic intervention to address the above deficits as  needed for improved function and QoL.     REHAB POTENTIAL: Good   CLINICAL DECISION MAKING: Evolving/moderate complexity   EVALUATION COMPLEXITY: High     GOALS: Goals  reviewed with patient? No   SHORT TERM GOALS: Target date: 12/18/2022   Pt will be independent with HEP in order to improve strength and balance in order to decrease fall risk and improve function at home. Baseline: 11/27/22: Baseline HEP initiated Goal status: ACHIEVED     LONG TERM GOALS: Target date: 01/22/2023   Pt will increase FOTO to at least 70 to demonstrate significant improvement in function at home related to balance  Baseline: 11/27/22: 64.      12/27/22: 50.     02/05/23: 69/70 Goal status: IN PROGRESS   2.  Pt will improve BERG by at least 3 points in order to demonstrate clinically significant improvement in balance.   Baseline: 11/27/22: BERG 47/56.     12/27/22: BERG 47/56.     02/05/23:  BERG 48/56 Goal status: ON-GOING   3.  Pt will improve ABC by at least 13% in order to demonstrate clinically significant improvement in balance confidence.      Baseline: 11/27/22: 36.9%  12/27/22 : 45.6%.    02/05/23: 69.4% Goal status: ACHIEVED   4. Pt will decrease 5TSTS by at least 3 seconds in order to demonstrate clinically significant improvement in LE strength      Baseline: 11/27/22: 18 sec  12/27/22: 17.12 sec.  02/05/23: 10.4 Goal status: ACHIEVED    5. Pt will improve DGI by at least 3 points in order to demonstrate clinically significant improvement in balance and decreased risk for falls.     Baseline: 11/27/22: Deferred to visit # 2.      11/29/22: 15/24     12/27/22:12/24.     02/05/23: 12/24 Goal status: NOT MET   6. Pt will decrease TUG to below 12 seconds/decrease in order to demonstrate decreased fall risk.  Baseline: 11/27/22: 13.6 sec     12/27/22: 11.5 sec Goal status: ACHIEVED   7. Pt will increase 6MWT by at least 16m (165ft) in order to demonstrate clinically significant improvement in cardiopulmonary endurance and community ambulation  Baseline: 11/27/22: Deferred to visit # 2.   11/29/22: 980 feet.  12/27/22:Deferred  01/01/23: 840 ft.     02/05/23: 1050 ft Goal status: ACHIEVED       PLAN: PT FREQUENCY: 2x/week   PT DURATION: 8 weeks   PLANNED INTERVENTIONS: Therapeutic exercises, Therapeutic activity, Neuromuscular re-education, Balance training, Gait training, Patient/Family education, Electrical stimulation   PLAN FOR NEXT SESSION: Continue with drills re-training obstacle negotiation, sit to stand, and static postural control. Integrate activities requiring acceleration/deceleration during ambulation, dual tasking during gait requiring head turns.     Valentina Gu, PT, DPT [phone] 02/19/2023 10:30 AM

## 2023-02-21 ENCOUNTER — Ambulatory Visit: Payer: Medicare HMO | Admitting: Physical Therapy

## 2023-02-21 ENCOUNTER — Encounter: Payer: Self-pay | Admitting: Physical Therapy

## 2023-02-21 DIAGNOSIS — Z9181 History of falling: Secondary | ICD-10-CM

## 2023-02-21 DIAGNOSIS — R262 Difficulty in walking, not elsewhere classified: Secondary | ICD-10-CM

## 2023-02-21 DIAGNOSIS — R2689 Other abnormalities of gait and mobility: Secondary | ICD-10-CM

## 2023-02-21 DIAGNOSIS — M6281 Muscle weakness (generalized): Secondary | ICD-10-CM

## 2023-02-21 NOTE — Therapy (Addendum)
OUTPATIENT PHYSICAL THERAPY TREATMENT  Patient Name: Barbara Ballard MRN: [id number] DOB:[birth year], 77 y.o., female Today's Date: 02/21/2023   PCP: [facility], [place] PROVIDER: Hilton Sinclair, PA-C   END OF SESSION:   PT End of Session - 02/21/23 1040     Visit Number 19    Number of Visits 22    Date for PT Re-Evaluation 03/14/23    Progress Note Due on Visit 20    PT Start Time 1033    PT Stop Time 1112    PT Time Calculation (min) 39 min    Equipment Utilized During Treatment Gait belt    Activity Tolerance Patient tolerated treatment well    Behavior During Therapy WFL for tasks assessed/performed               Past Medical History:  Diagnosis Date   Depression    High cholesterol    Hypertension    Restless leg    Past Surgical History:  Procedure Laterality Date   BUNIONECTOMY     CHOLECYSTECTOMY     Patient Active Problem List   Diagnosis Date Noted   Depression 03/21/2022   Essential hypertension 03/21/2022   Hyperlipidemia 03/21/2022   Restless leg syndrome 03/21/2022   Preoperative evaluation to rule out surgical contraindication 07/17/2021   Dermatochalasis of upper and lower eyelids of both eyes 02/23/2021   Diplopia 02/23/2021   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   Nuclear sclerotic cataract of both eyes 02/23/2021   Bilateral sensorineural hearing loss 09/09/2020   Orthostatic hypotension 07/15/2020   Grief 12/23/2019   Hypokalemia 12/23/2019   Osteopenia of spine 08/04/2019   Moderate episode of recurrent major depressive disorder ([place]) 08/12/2018   Dental decay 03/19/2018   Closed displaced fracture of distal phalanx of left middle finger with routine healing 12/24/2016   Fracture of lumbar spine ([place]) 04/11/2014   AK (actinic keratosis) 11/04/2012   Neuropathy 05/05/2012   Personal history of other malignant neoplasm of skin 04/22/2012    REFERRING DIAG:  R29.6  (ICD-10-CM) - Repeated falls    THERAPY DIAG:  History of falling  Difficulty in walking, not elsewhere classified  Muscle weakness (generalized)  Imbalance  Rationale for Evaluation and Treatment Rehabilitation  PERTINENT HISTORY: Patient is a 77 year old female referred to outpatient PT for frequent falls following hospital discharge (pt was admitted 10/16/22 due to pneumonia and had positive urinalysis for infection). Patient currently reports that her condition has improved, but she still has not gotten her voice back. Pt denies chest pain or SOB with getting around. Pt reports difficulty with balance and fear of falling. She reports unsteadiness on her feet that is worse after prolonged sitting. Pt reports LOB with bending down e.g. doing gardening and trying to get up. She reports no major change since hospitalization with this. Pt reports neuropathy affecting her feet.   Pain: No Numbness/Tingling: Yes, numbness in feet and paresthesias in feet/toes  Focal Weakness: No Recent changes in overall health/medication: Yes, hospitalization in November  Prior history of physical therapy for balance:  Yes, history of PT for balance in 2916  Falls: Has patient fallen in last 6 months? Yes, Number of falls: 3-4 times, non-injurious falls  Directional pattern for falls: Yes, forward  Imaging: No  Prior level of function: Independent Occupational demands: N/A Hobbies: walking, gardening    Red flags (bowel/bladder changes, saddle paresthesia, personal history of cancer, h/o spinal tumors, h/o compression fx, h/o abdominal  aneurysm, abdominal pain, chills/fever, night sweats, nausea, vomiting, unrelenting pain): Positive for history of skin cancer only on patient's chart   Weight Bearing Restrictions: No   Living Environment Lives with: lives alone, her son lives nearby; daughter lives in [place]  Lives in: House/apartment,  home is 2 levels. She has spiral staircase to get to 2nd floor  with handrail on one side. Her bedroom/bathroom is downstairs.  2 steps to get into home at back entrance, handrail on both sides. Has following equipment at home: Single point cane, shower chair, Grab bars, and bars for toilet transfer      Patient Goals: "Strengthen my balance", more endurance     PRECAUTIONS: Fall history  SUBJECTIVE:                                                                                                                                                                                      SUBJECTIVE STATEMENT:  Pt reports no other major incidents since Tuesday. She reports some soreness in her thighs/legs following new exercises last visit; she states this was mild. Patient reports no other significant changes today.     OBJECTIVE: (objective measures completed at initial evaluation unless otherwise dated)   Patient Surveys (initial eval) FOTO: 64 ,predicted improvement to 70 ABC: 36.9%     GAIT: Distance walked: 150 Assistive device utilized: None Level of assistance: SBA Comments: Decreased arm swing and truncal rigidity, mild forward head and kyphotic posture maintained throughout gait cycle. Pt demonstrates decreased step length and increased cadence. Pt has sound heel strike bilaterally. No pelvic asymmetry or pelvic drop.      Posture: FHRS posture, increased thoracic kyphosis       LE MMT:   MMT (out of 5) Right 11/27/2022 Left 11/27/2022  Hip flexion 4- 4-  Hip extension      Hip abduction (seated) 4 4  Hip adduction (seated)  5 5  Hip internal rotation      Hip external rotation      Knee flexion 5 4+  Knee extension 4+ 5-  Ankle dorsiflexion 4+ 4+  Ankle plantarflexion      Ankle inversion      Ankle eversion      (* = pain; Blank rows = not tested)     Sensation Loss of light touch sensation in bilateral digits of feet      Reflexes Deferred     Cranial Nerves Deferred     Coordination/Cerebellar Finger to Nose:  WNL Heel to Shin: WNL Rapid alternating movements: WNL Finger Opposition: WNL Pronator Drift: Negative     FUNCTIONAL OUTCOME MEASURES -INITIAL EVALUATION    Results  Comments  BERG 47/56 Fall risk, in need of intervention  DGI 15/24    TUG 13.63 seconds Fall risk, in need of intervention    5TSTS 18 seconds Fall risk, in need of intervention    6 Minute Walk Test 980 ft    (Blank rows = not tested)          TODAY'S TREATMENT      Neuromuscular Re-education - for improved sensory integration, static and dynamic postural control, equilibrium and non-equilibrium coordination as needed for negotiating home and community environment and stepping over obstacles  (All drills performed with careful CGA)   High knees; 2x D/B blue agility ladder  Gait with ball toss for dual task; x4 D/B hallway  Blue star 8-way multidirectional stepping; with bilat LE, intermittent cueing for technique; x 3 minutes   Consecutive obstacle negotiations in hallway; (3) 6-inch hurdles, Airex pad, 6-inch step, Airex pad; x5 D/B with step-to pattern in hallway    *not today* Resisted forward stepping with controlled retro step to train tendency for posterior bias, with black tubing in // bars; x5 Toe tapping 6-inch step + Airex pad; 2x10 Cone reaching, pt reaching outside of BOS to challenge static postural control; 2x10 cones with L and R hand High knee with opposite knee tap; 2x D/B Forward and retro stepping; 5x D/B Standing feet together on Airex; 2x30 sec Stride stance with perturbations, RLE in back and LLE in back; x 1 minute each Stride stance ; 3x30 sec in each position (RLE in back and LLE in back) Sidestepping length // bars; 5x D/B Hurdle step over 5-lb Dbell; 2x10 with each LE  -heavy cueing for heel to toe stepping; cued for decreased upper limb support   Therapeutic Exercise - improved strength as needed to improve performance of CKC activities/functional movements and as  needed for power production to prevent fall during episode of large postural perturbation  NuStep; 5 minutes; Level 4, seat at 8, arms at 9   -subjective information gathered during this time  Sit to stand with Airex under feet and seated on Airex for increased chair height; 1x10 with standard sit to stand, 1x10 with ball overhead reach  Minilunge adjacent to treadmill armrest; reviewed for HEP -safe alternative for HEP   *next visit* TRX reverse lunge, partial range; 1x10 with each LE     *not today* Floor transfer, mat on floor adjacent to low mat table; x 5  -patient exhibits  Ambulate laps around gym; x 3 laps at beginning of session   3-way hip in standing; BUE support in parallel bars, with 4-lb ankle weights; 2x10 each direction bilaterally    PATIENT EDUCATION:  Education details: HEP review Person educated: Patient Education method: Explanation and Handouts Education comprehension: verbalized understanding     HOME EXERCISE PROGRAM: Access Code: [email or web address] URL: [email or web address] Date: 12/04/2022 Prepared by: Valentina Gu  Exercises - Sit to Stand with Counter Support  - 2 x daily - 7 x weekly - 2 sets - 10 reps - Heel Toe Raises with Counter Support  - 2 x daily - 7 x weekly - 2 sets - 10 reps - Standing March with Counter Support  - 2 x daily - 7 x weekly - 2 sets - 10 reps - Standing Hip Abduction with Unilateral Counter Support  - 2 x daily - 7 x weekly - 2 sets - 10 reps     ASSESSMENT:   CLINICAL IMPRESSION: Patient is able to progress with sit to  stand with overhead reach to train stability following completion of sit to stand and balancing during posterior perturbation. She demonstrates improving performance of multi-directional stepping drill, but she is still challenged by forward and backward steps with narrow BOS. Pt does need work specifically on ability to complete floor transfer given recent minor fall in her garden when  attempting to get up from kneeling position. Pt has remaining deficits in postural control, lower extremity strength, equilibrium lower limb coordination, and impaired proprioception/kinesthetic sense of lower limbs associated with neuropathy. Patient will benefit from continued skilled therapeutic intervention to address the above deficits as needed for improved function and QoL.     REHAB POTENTIAL: Good   CLINICAL DECISION MAKING: Evolving/moderate complexity   EVALUATION COMPLEXITY: High     GOALS: Goals reviewed with patient? No   SHORT TERM GOALS: Target date: 12/18/2022   Pt will be independent with HEP in order to improve strength and balance in order to decrease fall risk and improve function at home. Baseline: 11/27/22: Baseline HEP initiated Goal status: ACHIEVED     LONG TERM GOALS: Target date: 01/22/2023   Pt will increase FOTO to at least 70 to demonstrate significant improvement in function at home related to balance  Baseline: 11/27/22: 64.      12/27/22: 50.     02/05/23: 69/70 Goal status: IN PROGRESS   2.  Pt will improve BERG by at least 3 points in order to demonstrate clinically significant improvement in balance.   Baseline: 11/27/22: BERG 47/56.     12/27/22: BERG 47/56.     02/05/23:  BERG 48/56 Goal status: ON-GOING   3.  Pt will improve ABC by at least 13% in order to demonstrate clinically significant improvement in balance confidence.      Baseline: 11/27/22: 36.9%  12/27/22 : 45.6%.    02/05/23: 69.4% Goal status: ACHIEVED   4. Pt will decrease 5TSTS by at least 3 seconds in order to demonstrate clinically significant improvement in LE strength      Baseline: 11/27/22: 18 sec  12/27/22: 17.12 sec.  02/05/23: 10.4 Goal status: ACHIEVED    5. Pt will improve DGI by at least 3 points in order to demonstrate clinically significant improvement in balance and decreased risk for falls.     Baseline: 11/27/22: Deferred to visit # 2.      11/29/22: 15/24     12/27/22:12/24.      02/05/23: 12/24 Goal status: NOT MET   6. Pt will decrease TUG to below 12 seconds/decrease in order to demonstrate decreased fall risk.  Baseline: 11/27/22: 13.6 sec     12/27/22: 11.5 sec Goal status: ACHIEVED   7. Pt will increase 6MWT by at least 44m (136ft) in order to demonstrate clinically significant improvement in cardiopulmonary endurance and community ambulation  Baseline: 11/27/22: Deferred to visit # 2.   11/29/22: 980 feet.  12/27/22:Deferred  01/01/23: 840 ft.     02/05/23: 1050 ft Goal status: ACHIEVED      PLAN: PT FREQUENCY: 2x/week   PT DURATION: 8 weeks   PLANNED INTERVENTIONS: Therapeutic exercises, Therapeutic activity, Neuromuscular re-education, Balance training, Gait training, Patient/Family education, Electrical stimulation   PLAN FOR NEXT SESSION: Continue with drills re-training obstacle negotiation, sit to stand, and static postural control. Integrate activities requiring acceleration/deceleration during ambulation, dual tasking during gait requiring head turns. Pt will need re-assessment next visit.     Valentina Gu, PT, DPT [phone] 02/21/2023 11:12 AM

## 2023-02-26 ENCOUNTER — Encounter: Payer: Medicare HMO | Admitting: Physical Therapy

## 2023-02-28 ENCOUNTER — Encounter: Payer: Medicare HMO | Admitting: Physical Therapy

## 2023-03-05 ENCOUNTER — Encounter: Payer: Self-pay | Admitting: Physical Therapy

## 2023-03-05 ENCOUNTER — Ambulatory Visit: Payer: Medicare HMO | Attending: Family Medicine | Admitting: Physical Therapy

## 2023-03-05 DIAGNOSIS — R262 Difficulty in walking, not elsewhere classified: Secondary | ICD-10-CM | POA: Insufficient documentation

## 2023-03-05 DIAGNOSIS — M6281 Muscle weakness (generalized): Secondary | ICD-10-CM | POA: Insufficient documentation

## 2023-03-05 DIAGNOSIS — R2689 Other abnormalities of gait and mobility: Secondary | ICD-10-CM | POA: Insufficient documentation

## 2023-03-05 DIAGNOSIS — Z9181 History of falling: Secondary | ICD-10-CM | POA: Diagnosis present

## 2023-03-05 NOTE — Therapy (Signed)
OUTPATIENT PHYSICAL THERAPY TREATMENT AND PROGRESS NOTE   Dates of reporting period  02/05/23   to   03/05/23   Patient Name: Barbara Ballard MRN: [id number] DOB:17-Nov-1946, 77 y.o., female Today's Date: 03/05/2023   PCP: [facility], [place]  REFERRING PROVIDER: Ardyth Man, PA-C   END OF SESSION:   PT End of Session - 03/05/23 0952     Visit Number 20    Number of Visits 22    Date for PT Re-Evaluation 03/14/23    Progress Note Due on Visit 20    PT Start Time 0948    PT Stop Time 1029    PT Time Calculation (min) 41 min    Equipment Utilized During Treatment Gait belt    Activity Tolerance Patient tolerated treatment well    Behavior During Therapy [facility] for tasks assessed/performed              Past Medical History:  Diagnosis Date   Depression    High cholesterol    Hypertension    Restless leg    Past Surgical History:  Procedure Laterality Date   BUNIONECTOMY     CHOLECYSTECTOMY     Patient Active Problem List   Diagnosis Date Noted   Depression 03/21/2022   Essential hypertension 03/21/2022   Hyperlipidemia 03/21/2022   Restless leg syndrome 03/21/2022   Preoperative evaluation to rule out surgical contraindication 07/17/2021   Dermatochalasis of upper and lower eyelids of both eyes 02/23/2021   Diplopia 02/23/2021   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   Nuclear sclerotic cataract of both eyes 02/23/2021   Bilateral sensorineural hearing loss 09/09/2020   Orthostatic hypotension 07/15/2020   Grief 12/23/2019   Hypokalemia 12/23/2019   Osteopenia of spine 08/04/2019   Moderate episode of recurrent major depressive disorder 08/12/2018   Dental decay 03/19/2018   Closed displaced fracture of distal phalanx of left middle finger with routine healing 12/24/2016   Fracture of lumbar spine 04/11/2014   AK (actinic keratosis) 11/04/2012   Neuropathy 05/05/2012   Personal history of other  malignant neoplasm of skin 04/22/2012    REFERRING DIAG:  R29.6 (ICD-10-CM) - Repeated falls    THERAPY DIAG:  History of falling  Difficulty in walking, not elsewhere classified  Muscle weakness (generalized)  Imbalance  Rationale for Evaluation and Treatment Rehabilitation  PERTINENT HISTORY: Patient is a 77 year old female referred to outpatient PT for frequent falls following hospital discharge (pt was admitted 10/16/22 due to pneumonia and had positive urinalysis for infection). Patient currently reports that her condition has improved, but she still has not gotten her voice back. Pt denies chest pain or SOB with getting around. Pt reports difficulty with balance and fear of falling. She reports unsteadiness on her feet that is worse after prolonged sitting. Pt reports LOB with bending down e.g. doing gardening and trying to get up. She reports no major change since hospitalization with this. Pt reports neuropathy affecting her feet.   Pain: No Numbness/Tingling: Yes, numbness in feet and paresthesias in feet/toes  Focal Weakness: No Recent changes in overall health/medication: Yes, hospitalization in November  Prior history of physical therapy for balance:  Yes, history of PT for balance in 2916  Falls: Has patient fallen in last 6 months? Yes, Number of falls: 3-4 times, non-injurious falls  Directional pattern for falls: Yes, forward  Imaging: No  Prior level of function: Independent Occupational demands: N/A Hobbies: walking, gardening    Red flags (bowel/bladder  changes, saddle paresthesia, personal history of cancer, h/o spinal tumors, h/o compression fx, h/o abdominal aneurysm, abdominal pain, chills/fever, night sweats, nausea, vomiting, unrelenting pain): Positive for history of skin cancer only on patient's chart   Weight Bearing Restrictions: No   Living Environment Lives with: lives alone, her son lives nearby; daughter lives in [place]  Lives in:  House/apartment,  home is 2 levels. She has spiral staircase to get to 2nd floor with handrail on one side. Her bedroom/bathroom is downstairs.  2 steps to get into home at back entrance, handrail on both sides. Has following equipment at home: Single point cane, shower chair, Grab bars, and bars for toilet transfer      Patient Goals: "Strengthen my balance", more endurance     PRECAUTIONS: Fall history  SUBJECTIVE:                                                                                                                                                                                      SUBJECTIVE STATEMENT:  Pt reports noted improvement and confidence with her balance. 78% global rating of improvement. Patient reports more confidence and feeling betterwith transfers. Pt reports some discomfort with standing on one foot or lifting up her foot for weight shifting drills.     OBJECTIVE: (objective measures completed at initial evaluation unless otherwise dated)   Patient Surveys (initial eval) FOTO: 64 ,predicted improvement to 70 ABC: 36.9%     GAIT: Distance walked: 150 Assistive device utilized: None Level of assistance: SBA Comments: Decreased arm swing and truncal rigidity, mild forward head and kyphotic posture maintained throughout gait cycle. Pt demonstrates decreased step length and increased cadence. Pt has sound heel strike bilaterally. No pelvic asymmetry or pelvic drop.      Posture: FHRS posture, increased thoracic kyphosis       LE MMT:   MMT (out of 5) Right 11/27/2022 Left 11/27/2022  Hip flexion 4- 4-  Hip extension      Hip abduction (seated) 4 4  Hip adduction (seated)  5 5  Hip internal rotation      Hip external rotation      Knee flexion 5 4+  Knee extension 4+ 5-  Ankle dorsiflexion 4+ 4+  Ankle plantarflexion      Ankle inversion      Ankle eversion      (* = pain; Blank rows = not tested)     Sensation Loss of light touch sensation  in bilateral digits of feet      Reflexes Deferred     Cranial Nerves Deferred     Coordination/Cerebellar Finger to Nose: WNL Heel to Shin: WNL  Rapid alternating movements: WNL Finger Opposition: WNL Pronator Drift: Negative     FUNCTIONAL OUTCOME MEASURES -INITIAL EVALUATION    Results Comments  BERG 47/56 Fall risk, in need of intervention  DGI 15/24    TUG 13.63 seconds Fall risk, in need of intervention    5TSTS 18 seconds Fall risk, in need of intervention    6 Minute Walk Test 980 ft    (Blank rows = not tested)          TODAY'S TREATMENT      Neuromuscular Re-education - for improved sensory integration, static and dynamic postural control, equilibrium and non-equilibrium coordination as needed for negotiating home and community environment and stepping over obstacles   *next visit* (All drills performed with careful CGA)  High knees; 2x D/B blue agility ladder Gait with ball toss for dual task; x4 D/B hallway Blue star 8-way multidirectional stepping; with bilat LE, intermittent cueing for technique; x 3 minutes Consecutive obstacle negotiations in hallway; (3) 6-inch hurdles, Airex pad, 6-inch step, Airex pad; x5 D/B with step-to pattern in hallway    *not today* Resisted forward stepping with controlled retro step to train tendency for posterior bias, with black tubing in // bars; x5 Toe tapping 6-inch step + Airex pad; 2x10 Cone reaching, pt reaching outside of BOS to challenge static postural control; 2x10 cones with L and R hand High knee with opposite knee tap; 2x D/B Forward and retro stepping; 5x D/B Standing feet together on Airex; 2x30 sec Stride stance with perturbations, RLE in back and LLE in back; x 1 minute each Stride stance ; 3x30 sec in each position (RLE in back and LLE in back) Sidestepping length // bars; 5x D/B Hurdle step over 5-lb Dbell; 2x10 with each LE  -heavy cueing for heel to toe stepping; cued for decreased upper  limb support   Therapeutic Exercise - improved strength as needed to improve performance of CKC activities/functional movements and as needed for power production to prevent fall during episode of large postural perturbation  NuStep; 5 minutes; Level 4, seat at 8, arms at 9   -subjective information gathered during this time   *GOAL UPDATE PERFORMED  -including performance of functional outcome measures (DGI, BERG, FOTO)   PATIENT EDUCATION: Discussed current progress with physical therapy, expectations moving forward with POC, and anticipated discharge timeline   *next visit* TRX reverse lunge, partial range; 1x10 with each LE Sit to stand with Airex under feet; 2x10 with ball overhead reach   *not today* Minilunge adjacent to treadmill armrest; reviewed for HEP -safe alternative for HEP Floor transfer, mat on floor adjacent to low mat table; x 5  -patient exhibits  Ambulate laps around gym; x 3 laps at beginning of session   3-way hip in standing; BUE support in parallel bars, with 4-lb ankle weights; 2x10 each direction bilaterally    PATIENT EDUCATION:  Education details: HEP review Person educated: Patient Education method: Explanation and Handouts Education comprehension: verbalized understanding     HOME EXERCISE PROGRAM: Access Code: [email or web address] URL: [email or web address] Date: 12/04/2022 Prepared by: Consuela Mimes  Exercises - Sit to Stand with Counter Support  - 2 x daily - 7 x weekly - 2 sets - 10 reps - Heel Toe Raises with Counter Support  - 2 x daily - 7 x weekly - 2 sets - 10 reps - Standing March with Counter Support  - 2 x daily - 7 x weekly - 2 sets - 10 reps - Standing Hip  Abduction with Unilateral Counter Support  - 2 x daily - 7 x weekly - 2 sets - 10 reps     ASSESSMENT:   CLINICAL IMPRESSION: Patient is able to meet her DGI goal today. She has not met her FOTO goal and has small reduction in FOTO report compared to previous goal  update (though it is not reduced to baseline score). She reports subjective improvement in her confidence with functional mobility and steadiness. She has not yet met BERG goal, though she was above established cutoff of 45 at baseline. Patient is still notably challenged with changing direction during gait, postural control with narrow BOS and uneven surfaces, and floor transfers. Pt has remaining deficits in postural control, lower extremity strength, equilibrium lower limb coordination, and impaired proprioception/kinesthetic sense of lower limbs associated with neuropathy. Patient will benefit from continued skilled therapeutic intervention to address the above deficits as needed for improved function and QoL.     REHAB POTENTIAL: Good   CLINICAL DECISION MAKING: Evolving/moderate complexity   EVALUATION COMPLEXITY: High     GOALS: Goals reviewed with patient? No   SHORT TERM GOALS: Target date: 12/18/2022   Pt will be independent with HEP in order to improve strength and balance in order to decrease fall risk and improve function at home. Baseline: 11/27/22: Baseline HEP initiated Goal status: ACHIEVED     LONG TERM GOALS: Target date: 01/22/2023   Pt will increase FOTO to at least 70 to demonstrate significant improvement in function at home related to balance  Baseline: 11/27/22: 64.      12/27/22: 50.     02/05/23: 69/70   03/05/23: 59/70 Goal status: IN PROGRESS   2.  Pt will improve BERG by at least 3 points in order to demonstrate clinically significant improvement in balance.   Baseline: 11/27/22: BERG 47/56.     12/27/22: BERG 47/56.     02/05/23:  BERG 48/56.   03/05/23: BERG 46/56 Goal status: ON-GOING   3.  Pt will improve ABC by at least 13% in order to demonstrate clinically significant improvement in balance confidence.      Baseline: 11/27/22: 36.9%  12/27/22 : 45.6%.    02/05/23: 69.4% Goal status: ACHIEVED   4. Pt will decrease 5TSTS by at least 3 seconds in order to demonstrate  clinically significant improvement in LE strength      Baseline: 11/27/22: 18 sec  12/27/22: 17.12 sec.  02/05/23: 10.4 Goal status: ACHIEVED    5. Pt will improve DGI by at least 3 points in order to demonstrate clinically significant improvement in balance and decreased risk for falls.     Baseline: 11/27/22: Deferred to visit # 2.      11/29/22: 15/24     12/27/22:12/24.     02/05/23: 12/24   03/05/23: 18/24 Goal status: ACHIEVED   6. Pt will decrease TUG to below 12 seconds/decrease in order to demonstrate decreased fall risk.  Baseline: 11/27/22: 13.6 sec     12/27/22: 11.5 sec Goal status: ACHIEVED   7. Pt will increase by at least 4m (146ft) in order to demonstrate clinically significant improvement in cardiopulmonary endurance and community ambulation  Baseline: 11/27/22: Deferred to visit # 2.   11/29/22: 980 feet.  12/27/22:Deferred  01/01/23: 840 ft.     02/05/23: 1050 ft Goal status: ACHIEVED      PLAN: PT FREQUENCY: 2x/week   PT DURATION: 3-4 weeks   PLANNED INTERVENTIONS: Therapeutic exercises, Therapeutic activity, Neuromuscular re-education, Balance training, Gait  training, Patient/Family education, Electrical stimulation   PLAN FOR NEXT SESSION: Continue with drills re-training obstacle negotiation, sit to stand, and static postural control including narrow BOS/uneven surfaces. Continue with activities requiring acceleration/deceleration during ambulation, dual tasking during gait requiring head turns. Continue with improving ability to perform lunging and floor transfer as needed for getting off of ground during gardening   Consuela Mimes, PT, [place] #[id number] 03/06/2023 10:17 AM

## 2023-03-07 ENCOUNTER — Ambulatory Visit: Payer: Medicare HMO | Admitting: Physical Therapy

## 2023-03-07 DIAGNOSIS — Z9181 History of falling: Secondary | ICD-10-CM | POA: Diagnosis not present

## 2023-03-07 DIAGNOSIS — R2689 Other abnormalities of gait and mobility: Secondary | ICD-10-CM

## 2023-03-07 DIAGNOSIS — R262 Difficulty in walking, not elsewhere classified: Secondary | ICD-10-CM

## 2023-03-07 DIAGNOSIS — M6281 Muscle weakness (generalized): Secondary | ICD-10-CM

## 2023-03-07 NOTE — Therapy (Signed)
OUTPATIENT PHYSICAL THERAPY TREATMENT   Patient Name: RAVEENA HEBDON MRN: [id number] DOB:03-26-1946, 77 y.o., female Today's Date: 03/07/2023   PCP: [facility], [place]  REFERRING PROVIDER: Ardyth Man, PA-C   END OF SESSION:   PT End of Session - 03/07/23 1255     Visit Number 21    Number of Visits 28    Date for PT Re-Evaluation 04/04/23    Authorization Type [employer] 2024    Progress Note Due on Visit 20    PT Start Time 1257    PT Stop Time 1339    PT Time Calculation (min) 42 min    Equipment Utilized During Treatment Gait belt    Activity Tolerance Patient tolerated treatment well    Behavior During Therapy WFL for tasks assessed/performed             Past Medical History:  Diagnosis Date   Depression    High cholesterol    Hypertension    Restless leg    Past Surgical History:  Procedure Laterality Date   BUNIONECTOMY     CHOLECYSTECTOMY     Patient Active Problem List   Diagnosis Date Noted   Depression 03/21/2022   Essential hypertension 03/21/2022   Hyperlipidemia 03/21/2022   Restless leg syndrome 03/21/2022   Preoperative evaluation to rule out surgical contraindication 07/17/2021   Dermatochalasis of upper and lower eyelids of both eyes 02/23/2021   Diplopia 02/23/2021   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   Nuclear sclerotic cataract of both eyes 02/23/2021   Bilateral sensorineural hearing loss 09/09/2020   Orthostatic hypotension 07/15/2020   Grief 12/23/2019   Hypokalemia 12/23/2019   Osteopenia of spine 08/04/2019   Moderate episode of recurrent major depressive disorder 08/12/2018   Dental decay 03/19/2018   Closed displaced fracture of distal phalanx of left middle finger with routine healing 12/24/2016   Fracture of lumbar spine 04/11/2014   AK (actinic keratosis) 11/04/2012   Neuropathy 05/05/2012   Personal history of other malignant neoplasm of skin 04/22/2012     REFERRING DIAG:  R29.6 (ICD-10-CM) - Repeated falls    THERAPY DIAG:  History of falling  Difficulty in walking, not elsewhere classified  Muscle weakness (generalized)  Imbalance  Rationale for Evaluation and Treatment Rehabilitation  PERTINENT HISTORY: Patient is a 77 year old female referred to outpatient PT for frequent falls following hospital discharge (pt was admitted 10/16/22 due to pneumonia and had positive urinalysis for infection). Patient currently reports that her condition has improved, but she still has not gotten her voice back. Pt denies chest pain or SOB with getting around. Pt reports difficulty with balance and fear of falling. She reports unsteadiness on her feet that is worse after prolonged sitting. Pt reports LOB with bending down e.g. doing gardening and trying to get up. She reports no major change since hospitalization with this. Pt reports neuropathy affecting her feet.   Pain: No Numbness/Tingling: Yes, numbness in feet and paresthesias in feet/toes  Focal Weakness: No Recent changes in overall health/medication: Yes, hospitalization in November  Prior history of physical therapy for balance:  Yes, history of PT for balance in 2916  Falls: Has patient fallen in last 6 months? Yes, Number of falls: 3-4 times, non-injurious falls  Directional pattern for falls: Yes, forward  Imaging: No  Prior level of function: Independent Occupational demands: N/A Hobbies: walking, gardening    Red flags (bowel/bladder changes, saddle paresthesia, personal history of cancer, h/o spinal tumors,  h/o compression fx, h/o abdominal aneurysm, abdominal pain, chills/fever, night sweats, nausea, vomiting, unrelenting pain): Positive for history of skin cancer only on patient's chart   Weight Bearing Restrictions: No   Living Environment Lives with: lives alone, her son lives nearby; daughter lives in [place]  Lives in: House/apartment,  home is 2 levels. She has spiral  staircase to get to 2nd floor with handrail on one side. Her bedroom/bathroom is downstairs.  2 steps to get into home at back entrance, handrail on both sides. Has following equipment at home: Single point cane, shower chair, Grab bars, and bars for toilet transfer      Patient Goals: "Strengthen my balance", more endurance     PRECAUTIONS: Fall history     SUBJECTIVE:                                                                                                                                                                                      SUBJECTIVE STATEMENT:  Pt reports no recent falls or near-falls. Pt reports no new complaints this afternoon.     OBJECTIVE: (objective measures completed at initial evaluation unless otherwise dated)   Patient Surveys (initial eval) FOTO: 64 ,predicted improvement to 70 ABC: 36.9%     GAIT: Distance walked: 150 Assistive device utilized: None Level of assistance: SBA Comments: Decreased arm swing and truncal rigidity, mild forward head and kyphotic posture maintained throughout gait cycle. Pt demonstrates decreased step length and increased cadence. Pt has sound heel strike bilaterally. No pelvic asymmetry or pelvic drop.      Posture: FHRS posture, increased thoracic kyphosis       LE MMT:   MMT (out of 5) Right 11/27/2022 Left 11/27/2022  Hip flexion 4- 4-  Hip extension      Hip abduction (seated) 4 4  Hip adduction (seated)  5 5  Hip internal rotation      Hip external rotation      Knee flexion 5 4+  Knee extension 4+ 5-  Ankle dorsiflexion 4+ 4+  Ankle plantarflexion      Ankle inversion      Ankle eversion      (* = pain; Blank rows = not tested)     Sensation Loss of light touch sensation in bilateral digits of feet      Reflexes Deferred     Cranial Nerves Deferred     Coordination/Cerebellar Finger to Nose: WNL Heel to Shin: WNL Rapid alternating movements: WNL Finger Opposition:  WNL Pronator Drift: Negative     FUNCTIONAL OUTCOME MEASURES -INITIAL EVALUATION    Results Comments  BERG 47/56 Fall risk, in need of intervention  DGI 15/24    TUG 13.63 seconds Fall risk, in need of intervention    5TSTS 18 seconds Fall risk, in need of intervention    6 Minute Walk Test 980 ft    (Blank rows = not tested)         TODAY'S TREATMENT      Neuromuscular Re-education - for improved sensory integration, static and dynamic postural control, equilibrium and non-equilibrium coordination as needed for negotiating home and community environment and stepping over obstacles    (All drills performed with careful CGA)   High knees; 3x D/B blue agility ladder Gait with ball toss for dual task; x4 D/B hallway  Blue star 8-way multidirectional stepping; with bilat LE, intermittent cueing for technique; x 5 around clock   Consecutive obstacle negotiations in hallway; (3) 6-inch hurdles, Airex pad, 6-inch step, Airex pad; x5 D/B with step-to pattern in hallway  Resisted forward stepping with controlled retro step to train tendency for posterior bias, with black tubing in // bars; x5  *not today* Toe tapping 6-inch step + Airex pad; 2x10 Cone reaching, pt reaching outside of BOS to challenge static postural control; 2x10 cones with L and R hand High knee with opposite knee tap; 2x D/B Forward and retro stepping; 5x D/B Standing feet together on Airex; 2x30 sec Stride stance with perturbations, RLE in back and LLE in back; x 1 minute each Stride stance ; 3x30 sec in each position (RLE in back and LLE in back) Sidestepping length // bars; 5x D/B Hurdle step over 5-lb Dbell; 2x10 with each LE  -heavy cueing for heel to toe stepping; cued for decreased upper limb support   Therapeutic Exercise - improved strength as needed to improve performance of CKC activities/functional movements and as needed for power production to prevent fall during episode of large postural  perturbation  NuStep; 5 minutes; Level 4, seat at 8, arms at 8  -subjective information gathered during this time  TRX reverse lunge, partial range; 1x10 with each LE   PATIENT EDUCATION: Discussed goals of PT and strategies to improve floor transferring (continuing work on ability to perform lunging/getting up from half kneeling position). Reviewed technique for mini-lunge at kitchen counter.    *next visit* Sit to stand with Airex under feet; 2x10 with ball overhead reach   *not today* Minilunge adjacent to treadmill armrest; reviewed for HEP -safe alternative for HEP Floor transfer, mat on floor adjacent to low mat table; x 5  -patient exhibits  Ambulate laps around gym; x 3 laps at beginning of session   3-way hip in standing; BUE support in parallel bars, with 4-lb ankle weights; 2x10 each direction bilaterally    PATIENT EDUCATION:  Education details: HEP review Person educated: Patient Education method: Explanation and Handouts Education comprehension: verbalized understanding     HOME EXERCISE PROGRAM: Access Code: [email or web address] URL: [email or web address] Date: 12/04/2022 Prepared by: Consuela Mimes  Exercises - Sit to Stand with Counter Support  - 2 x daily - 7 x weekly - 2 sets - 10 reps - Heel Toe Raises with Counter Support  - 2 x daily - 7 x weekly - 2 sets - 10 reps - Standing March with Counter Support  - 2 x daily - 7 x weekly - 2 sets - 10 reps - Standing Hip Abduction with Unilateral Counter Support  - 2 x daily - 7 x weekly - 2 sets - 10 reps     ASSESSMENT:   CLINICAL IMPRESSION: Patient is significantly  challenged by rapid change of direction or rapid stepping and has frequent corrective lateral steps or cross-over steps to re-gain balance reactively. She is notably challenged with stepping up onto obstacle and intermittently does not clear edge of step with performing step up. Pt does need careful guarding with balance tasks due to  episodes of reactive balance strategies. Pt is still notably challenged with lunge movement pattern required for getting up from half kneeling position when performing floor transfer. Pt has remaining deficits in postural control, lower extremity strength, equilibrium lower limb coordination, and impaired proprioception/kinesthetic sense of lower limbs associated with neuropathy. Patient will benefit from continued skilled therapeutic intervention to address the above deficits as needed for improved function and QoL.     REHAB POTENTIAL: Good   CLINICAL DECISION MAKING: Evolving/moderate complexity   EVALUATION COMPLEXITY: High     GOALS: Goals reviewed with patient? No   SHORT TERM GOALS: Target date: 12/18/2022   Pt will be independent with HEP in order to improve strength and balance in order to decrease fall risk and improve function at home. Baseline: 11/27/22: Baseline HEP initiated Goal status: ACHIEVED     LONG TERM GOALS: Target date: 01/22/2023   Pt will increase FOTO to at least 70 to demonstrate significant improvement in function at home related to balance  Baseline: 11/27/22: 64.      12/27/22: 50.     02/05/23: 69/70   03/05/23: 59/70 Goal status: IN PROGRESS   2.  Pt will improve BERG by at least 3 points in order to demonstrate clinically significant improvement in balance.   Baseline: 11/27/22: BERG 47/56.     12/27/22: BERG 47/56.     02/05/23:  BERG 48/56.   03/05/23: BERG 46/56 Goal status: ON-GOING   3.  Pt will improve ABC by at least 13% in order to demonstrate clinically significant improvement in balance confidence.      Baseline: 11/27/22: 36.9%  12/27/22 : 45.6%.    02/05/23: 69.4% Goal status: ACHIEVED   4. Pt will decrease 5TSTS by at least 3 seconds in order to demonstrate clinically significant improvement in LE strength      Baseline: 11/27/22: 18 sec  12/27/22: 17.12 sec.  02/05/23: 10.4 Goal status: ACHIEVED    5. Pt will improve DGI by at least 3 points in order to  demonstrate clinically significant improvement in balance and decreased risk for falls.     Baseline: 11/27/22: Deferred to visit # 2.      11/29/22: 15/24     12/27/22:12/24.     02/05/23: 12/24   03/05/23: 18/24 Goal status: ACHIEVED   6. Pt will decrease TUG to below 12 seconds/decrease in order to demonstrate decreased fall risk.  Baseline: 11/27/22: 13.6 sec     12/27/22: 11.5 sec Goal status: ACHIEVED   7. Pt will increase by at least 18m (117ft) in order to demonstrate clinically significant improvement in cardiopulmonary endurance and community ambulation  Baseline: 11/27/22: Deferred to visit # 2.   11/29/22: 980 feet.  12/27/22:Deferred  01/01/23: 840 ft.     02/05/23: 1050 ft Goal status: ACHIEVED      PLAN: PT FREQUENCY: 2x/week   PT DURATION: 3-4 weeks   PLANNED INTERVENTIONS: Therapeutic exercises, Therapeutic activity, Neuromuscular re-education, Balance training, Gait training, Patient/Family education, Electrical stimulation   PLAN FOR NEXT SESSION: Continue with drills re-training obstacle negotiation, sit to stand, and static postural control including narrow BOS/uneven surfaces. Continue with activities requiring acceleration/deceleration during ambulation, dual  tasking during gait requiring head turns. Continue with improving ability to perform lunging and floor transfer as needed for getting off of ground during gardening   Consuela MimesJeremy Treyshaun Keatts, PT, [place]PT #[id number] 03/07/2023 12:58 PM

## 2023-03-20 ENCOUNTER — Ambulatory Visit: Payer: Medicare HMO | Admitting: Physical Therapy

## 2023-03-20 ENCOUNTER — Encounter: Payer: Self-pay | Admitting: Physical Therapy

## 2023-03-20 DIAGNOSIS — Z9181 History of falling: Secondary | ICD-10-CM

## 2023-03-20 DIAGNOSIS — M6281 Muscle weakness (generalized): Secondary | ICD-10-CM

## 2023-03-20 DIAGNOSIS — R262 Difficulty in walking, not elsewhere classified: Secondary | ICD-10-CM

## 2023-03-20 DIAGNOSIS — R2689 Other abnormalities of gait and mobility: Secondary | ICD-10-CM

## 2023-03-20 NOTE — Therapy (Signed)
OUTPATIENT PHYSICAL THERAPY TREATMENT   Patient Name: Barbara Ballard MRN: [id number] DOB:12-10-1945, 77 y.o., female Today's Date: 03/20/2023   PCP: [facility], [place]  REFERRING PROVIDER: Ardyth Man, PA-C   END OF SESSION:   PT End of Session - 03/20/23 0733     Visit Number 22    Number of Visits 28    Date for PT Re-Evaluation 04/04/23    Authorization Type [employer] 2024    Progress Note Due on Visit 20    PT Start Time 0730    PT Stop Time 0812    PT Time Calculation (min) 42 min    Equipment Utilized During Treatment Gait belt    Activity Tolerance Patient tolerated treatment well    Behavior During Therapy [facility] for tasks assessed/performed             Past Medical History:  Diagnosis Date   Depression    High cholesterol    Hypertension    Restless leg    Past Surgical History:  Procedure Laterality Date   BUNIONECTOMY     CHOLECYSTECTOMY     Patient Active Problem List   Diagnosis Date Noted   Depression 03/21/2022   Essential hypertension 03/21/2022   Hyperlipidemia 03/21/2022   Restless leg syndrome 03/21/2022   Preoperative evaluation to rule out surgical contraindication 07/17/2021   Dermatochalasis of upper and lower eyelids of both eyes 02/23/2021   Diplopia 02/23/2021   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   Nuclear sclerotic cataract of both eyes 02/23/2021   Bilateral sensorineural hearing loss 09/09/2020   Orthostatic hypotension 07/15/2020   Grief 12/23/2019   Hypokalemia 12/23/2019   Osteopenia of spine 08/04/2019   Moderate episode of recurrent major depressive disorder 08/12/2018   Dental decay 03/19/2018   Closed displaced fracture of distal phalanx of left middle finger with routine healing 12/24/2016   Fracture of lumbar spine 04/11/2014   AK (actinic keratosis) 11/04/2012   Neuropathy 05/05/2012   Personal history of other malignant neoplasm of skin 04/22/2012     REFERRING DIAG:  R29.6 (ICD-10-CM) - Repeated falls    THERAPY DIAG:  History of falling  Difficulty in walking, not elsewhere classified  Muscle weakness (generalized)  Imbalance  Rationale for Evaluation and Treatment Rehabilitation  PERTINENT HISTORY: Patient is a 77 year old female referred to outpatient PT for frequent falls following hospital discharge (pt was admitted 10/16/22 due to pneumonia and had positive urinalysis for infection). Patient currently reports that her condition has improved, but she still has not gotten her voice back. Pt denies chest pain or SOB with getting around. Pt reports difficulty with balance and fear of falling. She reports unsteadiness on her feet that is worse after prolonged sitting. Pt reports LOB with bending down e.g. doing gardening and trying to get up. She reports no major change since hospitalization with this. Pt reports neuropathy affecting her feet.   Pain: No Numbness/Tingling: Yes, numbness in feet and paresthesias in feet/toes  Focal Weakness: No Recent changes in overall health/medication: Yes, hospitalization in November  Prior history of physical therapy for balance:  Yes, history of PT for balance in 2916  Falls: Has patient fallen in last 6 months? Yes, Number of falls: 3-4 times, non-injurious falls  Directional pattern for falls: Yes, forward  Imaging: No  Prior level of function: Independent Occupational demands: N/A Hobbies: walking, gardening    Red flags (bowel/bladder changes, saddle paresthesia, personal history of cancer, h/o spinal tumors,  h/o compression fx, h/o abdominal aneurysm, abdominal pain, chills/fever, night sweats, nausea, vomiting, unrelenting pain): Positive for history of skin cancer only on patient's chart   Weight Bearing Restrictions: No   Living Environment Lives with: lives alone, her son lives nearby; daughter lives in [place]  Lives in: House/apartment,  home is 2 levels. She has spiral  staircase to get to 2nd floor with handrail on one side. Her bedroom/bathroom is downstairs.  2 steps to get into home at back entrance, handrail on both sides. Has following equipment at home: Single point cane, shower chair, Grab bars, and bars for toilet transfer      Patient Goals: "Strengthen my balance", more endurance     PRECAUTIONS: Fall history     SUBJECTIVE:                                                                                                                                                                                      SUBJECTIVE STATEMENT:  Pt reports she has been careful and has not had any recent falls/near-falls. Patient reports using stool with bars to help her work in her garden.     OBJECTIVE: (objective measures completed at initial evaluation unless otherwise dated)   Patient Surveys (initial eval) FOTO: 64 ,predicted improvement to 70 ABC: 36.9%     GAIT: Distance walked: 150 Assistive device utilized: None Level of assistance: SBA Comments: Decreased arm swing and truncal rigidity, mild forward head and kyphotic posture maintained throughout gait cycle. Pt demonstrates decreased step length and increased cadence. Pt has sound heel strike bilaterally. No pelvic asymmetry or pelvic drop.      Posture: FHRS posture, increased thoracic kyphosis       LE MMT:   MMT (out of 5) Right 11/27/2022 Left 11/27/2022  Hip flexion 4- 4-  Hip extension      Hip abduction (seated) 4 4  Hip adduction (seated)  5 5  Hip internal rotation      Hip external rotation      Knee flexion 5 4+  Knee extension 4+ 5-  Ankle dorsiflexion 4+ 4+  Ankle plantarflexion      Ankle inversion      Ankle eversion      (* = pain; Blank rows = not tested)     Sensation Loss of light touch sensation in bilateral digits of feet      Reflexes Deferred     Cranial Nerves Deferred     Coordination/Cerebellar Finger to Nose: WNL Heel to Shin: WNL Rapid  alternating movements: WNL Finger Opposition: WNL Pronator Drift: Negative     FUNCTIONAL OUTCOME MEASURES -INITIAL EVALUATION  Results Comments  BERG 47/56 Fall risk, in need of intervention  DGI 15/24    TUG 13.63 seconds Fall risk, in need of intervention    5TSTS 18 seconds Fall risk, in need of intervention    6 Minute Walk Test 980 ft    (Blank rows = not tested)         TODAY'S TREATMENT      Neuromuscular Re-education - for improved sensory integration, static and dynamic postural control, equilibrium and non-equilibrium coordination as needed for negotiating home and community environment and stepping over obstacles    (All drills performed with careful CGA)   High knees; 4x D/B blue agility ladder Gait with ball toss for dual task; forward and backward stepping in hallway; x4 D/B hallway  Blue star 8-way multidirectional stepping; with bilat LE, intermittent cueing for technique; x 5 around clock   Consecutive obstacle negotiations in hallway; (3) 6-inch hurdles, Airex pad, 6-inch step, Airex pad; x5 D/B with step-to pattern in hallway  Resisted forward stepping with controlled retro step to train tendency for posterior bias, with black tubing in // bars; x5   *not today* Toe tapping 6-inch step + Airex pad; 2x10 Cone reaching, pt reaching outside of BOS to challenge static postural control; 2x10 cones with L and R hand High knee with opposite knee tap; 2x D/B Forward and retro stepping; 5x D/B Standing feet together on Airex; 2x30 sec Stride stance with perturbations, RLE in back and LLE in back; x 1 minute each Stride stance ; 3x30 sec in each position (RLE in back and LLE in back) Sidestepping length // bars; 5x D/B Hurdle step over 5-lb Dbell; 2x10 with each LE  -heavy cueing for heel to toe stepping; cued for decreased upper limb support   Therapeutic Exercise - improved strength as needed to improve performance of CKC activities/functional  movements and as needed for power production to prevent fall during episode of large postural perturbation  NuStep; 6 minutes; Level 4, seat at 7, arms at 8  -subjective information gathered during this time  Sit to stand with Airex under feet; 2x10 with ball overhead reach  TRX reverse lunge, partial range; 1x10 with each LE  PATIENT EDUCATION: Discussed goals of PT and expectations moving forward toward end of current POC    *not today* Minilunge adjacent to treadmill armrest; reviewed for HEP -safe alternative for HEP Floor transfer, mat on floor adjacent to low mat table; x 5  -patient exhibits  Ambulate laps around gym; x 3 laps at beginning of session   3-way hip in standing; BUE support in parallel bars, with 4-lb ankle weights; 2x10 each direction bilaterally    PATIENT EDUCATION:  Education details: HEP review Person educated: Patient Education method: Explanation and Handouts Education comprehension: verbalized understanding     HOME EXERCISE PROGRAM: Access Code: [email or web address] URL: [email or web address] Date: 12/04/2022 Prepared by: Consuela Mimes  Exercises - Sit to Stand with Counter Support  - 2 x daily - 7 x weekly - 2 sets - 10 reps - Heel Toe Raises with Counter Support  - 2 x daily - 7 x weekly - 2 sets - 10 reps - Standing March with Counter Support  - 2 x daily - 7 x weekly - 2 sets - 10 reps - Standing Hip Abduction with Unilateral Counter Support  - 2 x daily - 7 x weekly - 2 sets - 10 reps     ASSESSMENT:   CLINICAL IMPRESSION: Patient fortunately has not  experienced recent fall, and she uses appropriate equipment in her garden to lower likelihood of falling in garden again. Pt is still notably challenged with lunge movement pattern required to rise from half kneeling position during floor transfer. Pt also has intermittent posterior loss of balance following stepping up and stepping over when reactively righting her balance with  resumption of double-limb standing. Pt does need further work on reactive balance strategies and ability to maintain postural control with novel circumstances. Pt has remaining deficits in postural control, lower extremity strength, equilibrium lower limb coordination, and impaired proprioception/kinesthetic sense of lower limbs associated with neuropathy. Patient will benefit from continued skilled therapeutic intervention to address the above deficits as needed for improved function and QoL.     REHAB POTENTIAL: Good   CLINICAL DECISION MAKING: Evolving/moderate complexity   EVALUATION COMPLEXITY: High     GOALS: Goals reviewed with patient? No   SHORT TERM GOALS: Target date: 12/18/2022   Pt will be independent with HEP in order to improve strength and balance in order to decrease fall risk and improve function at home. Baseline: 11/27/22: Baseline HEP initiated Goal status: ACHIEVED     LONG TERM GOALS: Target date: 01/22/2023   Pt will increase FOTO to at least 70 to demonstrate significant improvement in function at home related to balance  Baseline: 11/27/22: 64.      12/27/22: 50.     02/05/23: 69/70   03/05/23: 59/70 Goal status: IN PROGRESS   2.  Pt will improve BERG by at least 3 points in order to demonstrate clinically significant improvement in balance.   Baseline: 11/27/22: BERG 47/56.     12/27/22: BERG 47/56.     02/05/23:  BERG 48/56.   03/05/23: BERG 46/56 Goal status: ON-GOING   3.  Pt will improve ABC by at least 13% in order to demonstrate clinically significant improvement in balance confidence.      Baseline: 11/27/22: 36.9%  12/27/22 : 45.6%.    02/05/23: 69.4% Goal status: ACHIEVED   4. Pt will decrease 5TSTS by at least 3 seconds in order to demonstrate clinically significant improvement in LE strength      Baseline: 11/27/22: 18 sec  12/27/22: 17.12 sec.  02/05/23: 10.4 Goal status: ACHIEVED    5. Pt will improve DGI by at least 3 points in order to demonstrate clinically  significant improvement in balance and decreased risk for falls.     Baseline: 11/27/22: Deferred to visit # 2.      11/29/22: 15/24     12/27/22:12/24.     02/05/23: 12/24   03/05/23: 18/24 Goal status: ACHIEVED   6. Pt will decrease TUG to below 12 seconds/decrease in order to demonstrate decreased fall risk.  Baseline: 11/27/22: 13.6 sec     12/27/22: 11.5 sec Goal status: ACHIEVED   7. Pt will increase by at least 39m (125ft) in order to demonstrate clinically significant improvement in cardiopulmonary endurance and community ambulation  Baseline: 11/27/22: Deferred to visit # 2.   11/29/22: 980 feet.  12/27/22:Deferred  01/01/23: 840 ft.     02/05/23: 1050 ft Goal status: ACHIEVED      PLAN: PT FREQUENCY: 2x/week   PT DURATION: 3-4 weeks   PLANNED INTERVENTIONS: Therapeutic exercises, Therapeutic activity, Neuromuscular re-education, Balance training, Gait training, Patient/Family education, Electrical stimulation   PLAN FOR NEXT SESSION: Continue with drills re-training obstacle negotiation, sit to stand, and static postural control including narrow BOS/uneven surfaces. Continue with activities requiring acceleration/deceleration during ambulation,  dual tasking during gait requiring head turns. Continue with improving ability to perform lunging and floor transfer as needed for getting off of ground during gardening   Consuela Mimes, PT, [place] #[id number] 03/20/2023 7:34 AM

## 2023-03-25 ENCOUNTER — Encounter: Payer: Self-pay | Admitting: Physical Therapy

## 2023-03-25 ENCOUNTER — Ambulatory Visit: Payer: Medicare HMO | Admitting: Physical Therapy

## 2023-03-25 DIAGNOSIS — R2689 Other abnormalities of gait and mobility: Secondary | ICD-10-CM

## 2023-03-25 DIAGNOSIS — R262 Difficulty in walking, not elsewhere classified: Secondary | ICD-10-CM

## 2023-03-25 DIAGNOSIS — Z9181 History of falling: Secondary | ICD-10-CM | POA: Diagnosis not present

## 2023-03-25 DIAGNOSIS — M6281 Muscle weakness (generalized): Secondary | ICD-10-CM

## 2023-03-25 NOTE — Therapy (Signed)
OUTPATIENT PHYSICAL THERAPY TREATMENT   Patient Name: Barbara Ballard MRN: [id number] DOB:07/03/1946, 77 y.o., female Today's Date: 03/25/2023   PCP: [facility], [place]  REFERRING PROVIDER: Ardyth Man, PA-C   END OF SESSION:   PT End of Session - 03/25/23 0739     Visit Number 23    Number of Visits 28    Date for PT Re-Evaluation 04/04/23    Authorization Type [employer] 2024    Progress Note Due on Visit 30    PT Start Time 0736    PT Stop Time 0814    PT Time Calculation (min) 38 min    Equipment Utilized During Treatment Gait belt    Activity Tolerance Patient tolerated treatment well    Behavior During Therapy [facility] for tasks assessed/performed              Past Medical History:  Diagnosis Date   Depression    High cholesterol    Hypertension    Restless leg    Past Surgical History:  Procedure Laterality Date   BUNIONECTOMY     CHOLECYSTECTOMY     Patient Active Problem List   Diagnosis Date Noted   Depression 03/21/2022   Essential hypertension 03/21/2022   Hyperlipidemia 03/21/2022   Restless leg syndrome 03/21/2022   Preoperative evaluation to rule out surgical contraindication 07/17/2021   Dermatochalasis of upper and lower eyelids of both eyes 02/23/2021   Diplopia 02/23/2021   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   Nuclear sclerotic cataract of both eyes 02/23/2021   Bilateral sensorineural hearing loss 09/09/2020   Orthostatic hypotension 07/15/2020   Grief 12/23/2019   Hypokalemia 12/23/2019   Osteopenia of spine 08/04/2019   Moderate episode of recurrent major depressive disorder (HCC) 08/12/2018   Dental decay 03/19/2018   Closed displaced fracture of distal phalanx of left middle finger with routine healing 12/24/2016   Fracture of lumbar spine (HCC) 04/11/2014   AK (actinic keratosis) 11/04/2012   Neuropathy 05/05/2012   Personal history of other malignant neoplasm of  skin 04/22/2012    REFERRING DIAG:  R29.6 (ICD-10-CM) - Repeated falls    THERAPY DIAG:  History of falling  Difficulty in walking, not elsewhere classified  Muscle weakness (generalized)  Imbalance  Rationale for Evaluation and Treatment Rehabilitation  PERTINENT HISTORY: Patient is a 77 year old female referred to outpatient PT for frequent falls following hospital discharge (pt was admitted 10/16/22 due to pneumonia and had positive urinalysis for infection). Patient currently reports that her condition has improved, but she still has not gotten her voice back. Pt denies chest pain or SOB with getting around. Pt reports difficulty with balance and fear of falling. She reports unsteadiness on her feet that is worse after prolonged sitting. Pt reports LOB with bending down e.g. doing gardening and trying to get up. She reports no major change since hospitalization with this. Pt reports neuropathy affecting her feet.   Pain: No Numbness/Tingling: Yes, numbness in feet and paresthesias in feet/toes  Focal Weakness: No Recent changes in overall health/medication: Yes, hospitalization in November  Prior history of physical therapy for balance:  Yes, history of PT for balance in 2916  Falls: Has patient fallen in last 6 months? Yes, Number of falls: 3-4 times, non-injurious falls  Directional pattern for falls: Yes, forward  Imaging: No  Prior level of function: Independent Occupational demands: N/A Hobbies: walking, gardening    Red flags (bowel/bladder changes, saddle paresthesia, personal history of cancer,  h/o spinal tumors, h/o compression fx, h/o abdominal aneurysm, abdominal pain, chills/fever, night sweats, nausea, vomiting, unrelenting pain): Positive for history of skin cancer only on patient's chart   Weight Bearing Restrictions: No   Living Environment Lives with: lives alone, her son lives nearby; daughter lives in [place]  Lives in: House/apartment,  home is 2 levels.  She has spiral staircase to get to 2nd floor with handrail on one side. Her bedroom/bathroom is downstairs.  2 steps to get into home at back entrance, handrail on both sides. Has following equipment at home: Single point cane, shower chair, Grab bars, and bars for toilet transfer      Patient Goals: "Strengthen my balance", more endurance     PRECAUTIONS: Fall history     SUBJECTIVE:                                                                                                                                                                                      SUBJECTIVE STATEMENT:  Pt reports no new concerns. She is being careful, but she is continuing gardening/activity outside and she feels this helps. Patient reports no recent falls. Pt is compliant with HEP.     OBJECTIVE: (objective measures completed at initial evaluation unless otherwise dated)   Patient Surveys (initial eval) FOTO: 64 ,predicted improvement to 70 ABC: 36.9%     GAIT: Distance walked: 150 Assistive device utilized: None Level of assistance: SBA Comments: Decreased arm swing and truncal rigidity, mild forward head and kyphotic posture maintained throughout gait cycle. Pt demonstrates decreased step length and increased cadence. Pt has sound heel strike bilaterally. No pelvic asymmetry or pelvic drop.      Posture: FHRS posture, increased thoracic kyphosis       LE MMT:   MMT (out of 5) Right 11/27/2022 Left 11/27/2022  Hip flexion 4- 4-  Hip extension      Hip abduction (seated) 4 4  Hip adduction (seated)  5 5  Hip internal rotation      Hip external rotation      Knee flexion 5 4+  Knee extension 4+ 5-  Ankle dorsiflexion 4+ 4+  Ankle plantarflexion      Ankle inversion      Ankle eversion      (* = pain; Blank rows = not tested)     Sensation Loss of light touch sensation in bilateral digits of feet      Reflexes Deferred     Cranial Nerves Deferred      Coordination/Cerebellar Finger to Nose: WNL Heel to Shin: WNL Rapid alternating movements: WNL Finger Opposition: WNL Pronator Drift: Negative  FUNCTIONAL OUTCOME MEASURES -INITIAL EVALUATION    Results Comments  BERG 47/56 Fall risk, in need of intervention  DGI 15/24    TUG 13.63 seconds Fall risk, in need of intervention    5TSTS 18 seconds Fall risk, in need of intervention    6 Minute Walk Test 980 ft    (Blank rows = not tested)         TODAY'S TREATMENT      Neuromuscular Re-education - for improved sensory integration, static and dynamic postural control, equilibrium and non-equilibrium coordination as needed for negotiating home and community environment and stepping over obstacles    (All drills performed with careful CGA)   High knees; 4x D/B blue agility ladder Gait with ball toss for dual task; forward and backward stepping in hallway; x4 D/B hallway  Stride stance perturbations; x 1 min with RLE in back and LLE in back  Blue star 8-way multidirectional stepping; with bilat LE, intermittent cueing for technique; x 5 around clock   Consecutive obstacle negotiations in hallway; (3) 6-inch hurdles, Airex pad, 6-inch step, Airex pad; x5 D/B with step-to pattern in hallway  Resisted forward stepping with controlled retro step to train tendency for posterior bias, with black tubing in // bars; x5   *not today* Toe tapping 6-inch step + Airex pad; 2x10 Cone reaching, pt reaching outside of BOS to challenge static postural control; 2x10 cones with L and R hand High knee with opposite knee tap; 2x D/B Forward and retro stepping; 5x D/B Standing feet together on Airex; 2x30 sec Stride stance with perturbations, RLE in back and LLE in back; x 1 minute each Stride stance ; 3x30 sec in each position (RLE in back and LLE in back) Sidestepping length // bars; 5x D/B Hurdle step over 5-lb Dbell; 2x10 with each LE  -heavy cueing for heel to toe stepping; cued  for decreased upper limb support   Therapeutic Exercise - improved strength as needed to improve performance of CKC activities/functional movements and as needed for power production to prevent fall during episode of large postural perturbation  NuStep; 6 minutes; Level 4, seat at 7, arms at 8  -subjective information gathered during this time  Sit to stand with Airex under feet; 2x10 with ball overhead reach   PATIENT EDUCATION: Discussed goals of PT and expectations moving forward toward end of current POC    *not today* TRX reverse lunge, partial range; 1x10 with each LE Minilunge adjacent to treadmill armrest; reviewed for HEP -safe alternative for HEP Floor transfer, mat on floor adjacent to low mat table; x 5  -patient exhibits  Ambulate laps around gym; x 3 laps at beginning of session   3-way hip in standing; BUE support in parallel bars, with 4-lb ankle weights; 2x10 each direction bilaterally    PATIENT EDUCATION:  Education details: HEP review Person educated: Patient Education method: Explanation and Handouts Education comprehension: verbalized understanding     HOME EXERCISE PROGRAM: Access Code: [email or web address] URL: [email or web address] Date: 12/04/2022 Prepared by: Consuela Mimes  Exercises - Sit to Stand with Counter Support  - 2 x daily - 7 x weekly - 2 sets - 10 reps - Heel Toe Raises with Counter Support  - 2 x daily - 7 x weekly - 2 sets - 10 reps - Standing March with Counter Support  - 2 x daily - 7 x weekly - 2 sets - 10 reps - Standing Hip Abduction with Unilateral Counter Support  - 2 x daily -  7 x weekly - 2 sets - 10 reps     ASSESSMENT:   CLINICAL IMPRESSION: Patient arrives with excellent motivation to participate in PT. She has intermittent posterior bias when attempting to perform rapid postural correction. Pt is intermittently challenged with multi-directional stepping task to simulate stepping strategy - largely likely  associated with sensory changes in BLE/feet. Pt has remaining deficits in postural control, lower extremity strength, equilibrium lower limb coordination, and impaired proprioception/kinesthetic sense of lower limbs associated with neuropathy. Patient will benefit from continued skilled therapeutic intervention to address the above deficits as needed for improved function and QoL.     REHAB POTENTIAL: Good   CLINICAL DECISION MAKING: Evolving/moderate complexity   EVALUATION COMPLEXITY: High     GOALS: Goals reviewed with patient? No   SHORT TERM GOALS: Target date: 12/18/2022   Pt will be independent with HEP in order to improve strength and balance in order to decrease fall risk and improve function at home. Baseline: 11/27/22: Baseline HEP initiated Goal status: ACHIEVED     LONG TERM GOALS: Target date: 01/22/2023   Pt will increase FOTO to at least 70 to demonstrate significant improvement in function at home related to balance  Baseline: 11/27/22: 64.      12/27/22: 50.     02/05/23: 69/70   03/05/23: 59/70 Goal status: IN PROGRESS   2.  Pt will improve BERG by at least 3 points in order to demonstrate clinically significant improvement in balance.   Baseline: 11/27/22: BERG 47/56.     12/27/22: BERG 47/56.     02/05/23:  BERG 48/56.   03/05/23: BERG 46/56 Goal status: ON-GOING   3.  Pt will improve ABC by at least 13% in order to demonstrate clinically significant improvement in balance confidence.      Baseline: 11/27/22: 36.9%  12/27/22 : 45.6%.    02/05/23: 69.4% Goal status: ACHIEVED   4. Pt will decrease 5TSTS by at least 3 seconds in order to demonstrate clinically significant improvement in LE strength      Baseline: 11/27/22: 18 sec  12/27/22: 17.12 sec.  02/05/23: 10.4 Goal status: ACHIEVED    5. Pt will improve DGI by at least 3 points in order to demonstrate clinically significant improvement in balance and decreased risk for falls.     Baseline: 11/27/22: Deferred to visit # 2.       11/29/22: 15/24     12/27/22:12/24.     02/05/23: 12/24   03/05/23: 18/24 Goal status: ACHIEVED   6. Pt will decrease TUG to below 12 seconds/decrease in order to demonstrate decreased fall risk.  Baseline: 11/27/22: 13.6 sec     12/27/22: 11.5 sec Goal status: ACHIEVED   7. Pt will increase by at least 81m (177ft) in order to demonstrate clinically significant improvement in cardiopulmonary endurance and community ambulation  Baseline: 11/27/22: Deferred to visit # 2.   11/29/22: 980 feet.  12/27/22:Deferred  01/01/23: 840 ft.     02/05/23: 1050 ft Goal status: ACHIEVED      PLAN: PT FREQUENCY: 2x/week   PT DURATION: 3-4 weeks   PLANNED INTERVENTIONS: Therapeutic exercises, Therapeutic activity, Neuromuscular re-education, Balance training, Gait training, Patient/Family education, Electrical stimulation   PLAN FOR NEXT SESSION: Continue with drills re-training obstacle negotiation, sit to stand, and static postural control including narrow BOS/uneven surfaces. Continue with activities requiring acceleration/deceleration during ambulation, dual tasking during gait requiring head turns. Continue with improving ability to perform lunging and floor transfer as needed for  getting off of ground during gardening   Consuela Mimes, [place], [place] #[id number] 03/25/2023 7:40 AM

## 2023-03-27 ENCOUNTER — Encounter: Payer: Self-pay | Admitting: Physical Therapy

## 2023-03-27 ENCOUNTER — Ambulatory Visit: Payer: Medicare HMO | Attending: Family Medicine | Admitting: Physical Therapy

## 2023-03-27 DIAGNOSIS — R262 Difficulty in walking, not elsewhere classified: Secondary | ICD-10-CM

## 2023-03-27 DIAGNOSIS — M6281 Muscle weakness (generalized): Secondary | ICD-10-CM | POA: Insufficient documentation

## 2023-03-27 DIAGNOSIS — Z9181 History of falling: Secondary | ICD-10-CM | POA: Insufficient documentation

## 2023-03-27 DIAGNOSIS — R2689 Other abnormalities of gait and mobility: Secondary | ICD-10-CM | POA: Diagnosis present

## 2023-03-27 NOTE — Therapy (Signed)
OUTPATIENT PHYSICAL THERAPY TREATMENT   Patient Name: Barbara Ballard MRN: [id number] DOB:1946/08/16, 77 y.o., female Today's Date: 03/27/2023   PCP: [facility], [place]  REFERRING PROVIDER: Ardyth Man, PA-C   END OF SESSION:   PT End of Session - 03/27/23 0908     Visit Number 24    Number of Visits 28    Date for PT Re-Evaluation 04/04/23    Authorization Type [employer] 2024    Progress Note Due on Visit 30    PT Start Time 0905    PT Stop Time 0931    PT Time Calculation (min) 26 min    Equipment Utilized During Treatment Gait belt    Activity Tolerance Patient tolerated treatment well    Behavior During Therapy [facility] for tasks assessed/performed               Past Medical History:  Diagnosis Date   Depression    High cholesterol    Hypertension    Restless leg    Past Surgical History:  Procedure Laterality Date   BUNIONECTOMY     CHOLECYSTECTOMY     Patient Active Problem List   Diagnosis Date Noted   Depression 03/21/2022   Essential hypertension 03/21/2022   Hyperlipidemia 03/21/2022   Restless leg syndrome 03/21/2022   Preoperative evaluation to rule out surgical contraindication 07/17/2021   Dermatochalasis of upper and lower eyelids of both eyes 02/23/2021   Diplopia 02/23/2021   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   Nuclear sclerotic cataract of both eyes 02/23/2021   Bilateral sensorineural hearing loss 09/09/2020   Orthostatic hypotension 07/15/2020   Grief 12/23/2019   Hypokalemia 12/23/2019   Osteopenia of spine 08/04/2019   Moderate episode of recurrent major depressive disorder (HCC) 08/12/2018   Dental decay 03/19/2018   Closed displaced fracture of distal phalanx of left middle finger with routine healing 12/24/2016   Fracture of lumbar spine (HCC) 04/11/2014   AK (actinic keratosis) 11/04/2012   Neuropathy 05/05/2012   Personal history of other malignant neoplasm of  skin 04/22/2012    REFERRING DIAG:  R29.6 (ICD-10-CM) - Repeated falls    THERAPY DIAG:  History of falling  Difficulty in walking, not elsewhere classified  Muscle weakness (generalized)  Imbalance  Rationale for Evaluation and Treatment Rehabilitation  PERTINENT HISTORY: Patient is a 77 year old female referred to outpatient PT for frequent falls following hospital discharge (pt was admitted 10/16/22 due to pneumonia and had positive urinalysis for infection). Patient currently reports that her condition has improved, but she still has not gotten her voice back. Pt denies chest pain or SOB with getting around. Pt reports difficulty with balance and fear of falling. She reports unsteadiness on her feet that is worse after prolonged sitting. Pt reports LOB with bending down e.g. doing gardening and trying to get up. She reports no major change since hospitalization with this. Pt reports neuropathy affecting her feet.   Pain: No Numbness/Tingling: Yes, numbness in feet and paresthesias in feet/toes  Focal Weakness: No Recent changes in overall health/medication: Yes, hospitalization in November  Prior history of physical therapy for balance:  Yes, history of PT for balance in 2916  Falls: Has patient fallen in last 6 months? Yes, Number of falls: 3-4 times, non-injurious falls  Directional pattern for falls: Yes, forward  Imaging: No  Prior level of function: Independent Occupational demands: N/A Hobbies: walking, gardening    Red flags (bowel/bladder changes, saddle paresthesia, personal history of  cancer, h/o spinal tumors, h/o compression fx, h/o abdominal aneurysm, abdominal pain, chills/fever, night sweats, nausea, vomiting, unrelenting pain): Positive for history of skin cancer only on patient's chart   Weight Bearing Restrictions: No   Living Environment Lives with: lives alone, her son lives nearby; daughter lives in [place]  Lives in: House/apartment,  home is 2 levels.  She has spiral staircase to get to 2nd floor with handrail on one side. Her bedroom/bathroom is downstairs.  2 steps to get into home at back entrance, handrail on both sides. Has following equipment at home: Single point cane, shower chair, Grab bars, and bars for toilet transfer      Patient Goals: "Strengthen my balance", more endurance     PRECAUTIONS: Fall history     SUBJECTIVE:                                                                                                                                                                                      SUBJECTIVE STATEMENT:  Pt reports she has appointment today and has to leave this office at 9:30 AM. She has to get to program at [facility]. Patient reports no recent falls or near-falls. Patient reports tolerating last visit well.     OBJECTIVE: (objective measures completed at initial evaluation unless otherwise dated)   Patient Surveys (initial eval) FOTO: 64 ,predicted improvement to 70 ABC: 36.9%     GAIT: Distance walked: 150 Assistive device utilized: None Level of assistance: SBA Comments: Decreased arm swing and truncal rigidity, mild forward head and kyphotic posture maintained throughout gait cycle. Pt demonstrates decreased step length and increased cadence. Pt has sound heel strike bilaterally. No pelvic asymmetry or pelvic drop.      Posture: FHRS posture, increased thoracic kyphosis       LE MMT:   MMT (out of 5) Right 11/27/2022 Left 11/27/2022  Hip flexion 4- 4-  Hip extension      Hip abduction (seated) 4 4  Hip adduction (seated)  5 5  Hip internal rotation      Hip external rotation      Knee flexion 5 4+  Knee extension 4+ 5-  Ankle dorsiflexion 4+ 4+  Ankle plantarflexion      Ankle inversion      Ankle eversion      (* = pain; Blank rows = not tested)     Sensation Loss of light touch sensation in bilateral digits of feet      Reflexes Deferred     Cranial Nerves Deferred      Coordination/Cerebellar Finger to Nose: WNL Heel to Shin: WNL Rapid alternating movements: WNL Finger Opposition: WNL  Pronator Drift: Negative     FUNCTIONAL OUTCOME MEASURES -INITIAL EVALUATION    Results Comments  BERG 47/56 Fall risk, in need of intervention  DGI 15/24    TUG 13.63 seconds Fall risk, in need of intervention    5TSTS 18 seconds Fall risk, in need of intervention    6 Minute Walk Test 980 ft    (Blank rows = not tested)         TODAY'S TREATMENT      Neuromuscular Re-education - for improved sensory integration, static and dynamic postural control, equilibrium and non-equilibrium coordination as needed for negotiating home and community environment and stepping over obstacles    (All drills performed with careful CGA)  Stride stance perturbations; x 1 min with RLE in back and LLE in back   Consecutive obstacle negotiations in hallway; (3) 6-inch hurdles, Airex pad, 6-inch step, Airex pad; x3 D/B with step-to pattern in hallway   *next visit* Gait with ball toss for dual task; forward and backward stepping in hallway; x4 D/B hallway Blue star 8-way multidirectional stepping; with bilat LE, intermittent cueing for technique; x 5 around clock    *not today* Resisted forward stepping with controlled retro step to train tendency for posterior bias, with black tubing in // bars; x5 Toe tapping 6-inch step + Airex pad; 2x10 Cone reaching, pt reaching outside of BOS to challenge static postural control; 2x10 cones with L and R hand High knee with opposite knee tap; 2x D/B Forward and retro stepping; 5x D/B Standing feet together on Airex; 2x30 sec Stride stance with perturbations, RLE in back and LLE in back; x 1 minute each Stride stance ; 3x30 sec in each position (RLE in back and LLE in back) Sidestepping length // bars; 5x D/B Hurdle step over 5-lb Dbell; 2x10 with each LE  -heavy cueing for heel to toe stepping; cued for decreased upper limb  support   Therapeutic Exercise - improved strength as needed to improve performance of CKC activities/functional movements and as needed for power production to prevent fall during episode of large postural perturbation  NuStep; 4 minutes; Level 5, seat at 7, arms at 8  -subjective information gathered during this time  Sit to stand with Airex under feet; 2x10 with ball overhead reach  TRX reverse lunge, partial range; 1x10 with each LE  Floor transfer, mat on floor adjacent to low mat table; x 5  -patient has difficulty initiating lift from floor from half kneeling position   PATIENT EDUCATION: Discussed safety with transferring from ground/floor and alternative techniques    *not today* Minilunge adjacent to treadmill armrest; reviewed for HEP -safe alternative for HEP Ambulate laps around gym; x 3 laps at beginning of session   3-way hip in standing; BUE support in parallel bars, with 4-lb ankle weights; 2x10 each direction bilaterally    PATIENT EDUCATION:  Education details: HEP review Person educated: Patient Education method: Explanation and Handouts Education comprehension: verbalized understanding     HOME EXERCISE PROGRAM: Access Code: [email or web address] URL: [email or web address] Date: 12/04/2022 Prepared by: Consuela Mimes  Exercises - Sit to Stand with Counter Support  - 2 x daily - 7 x weekly - 2 sets - 10 reps - Heel Toe Raises with Counter Support  - 2 x daily - 7 x weekly - 2 sets - 10 reps - Standing March with Counter Support  - 2 x daily - 7 x weekly - 2 sets - 10 reps - Standing Hip Abduction with Unilateral  Counter Support  - 2 x daily - 7 x weekly - 2 sets - 10 reps     ASSESSMENT:   CLINICAL IMPRESSION: PT visit is shortened today due to patient having to leave for appointment at [facility]. She is able to safely perform floor transfer with unilateral upper extremity support to maintain balance during push upward from half kneeling position -  pt is notably challenged with floor transfer without UE assist. Patient demonstrates significantly improved obstacle negotiation with pt not having to grab adjacent wall or therapist following hurdle steps in hallway; intermittent postural corrections only. Pt has remaining deficits in postural control, lower extremity strength, equilibrium lower limb coordination, and impaired proprioception/kinesthetic sense of lower limbs associated with neuropathy. Patient will benefit from continued skilled therapeutic intervention to address the above deficits as needed for improved function and QoL.     REHAB POTENTIAL: Good   CLINICAL DECISION MAKING: Evolving/moderate complexity   EVALUATION COMPLEXITY: High     GOALS: Goals reviewed with patient? No   SHORT TERM GOALS: Target date: 12/18/2022   Pt will be independent with HEP in order to improve strength and balance in order to decrease fall risk and improve function at home. Baseline: 11/27/22: Baseline HEP initiated Goal status: ACHIEVED     LONG TERM GOALS: Target date: 01/22/2023   Pt will increase FOTO to at least 70 to demonstrate significant improvement in function at home related to balance  Baseline: 11/27/22: 64.      12/27/22: 50.     02/05/23: 69/70   03/05/23: 59/70 Goal status: IN PROGRESS   2.  Pt will improve BERG by at least 3 points in order to demonstrate clinically significant improvement in balance.   Baseline: 11/27/22: BERG 47/56.     12/27/22: BERG 47/56.     02/05/23:  BERG 48/56.   03/05/23: BERG 46/56 Goal status: ON-GOING   3.  Pt will improve ABC by at least 13% in order to demonstrate clinically significant improvement in balance confidence.      Baseline: 11/27/22: 36.9%  12/27/22 : 45.6%.    02/05/23: 69.4% Goal status: ACHIEVED   4. Pt will decrease 5TSTS by at least 3 seconds in order to demonstrate clinically significant improvement in LE strength      Baseline: 11/27/22: 18 sec  12/27/22: 17.12 sec.  02/05/23: 10.4 Goal status:  ACHIEVED    5. Pt will improve DGI by at least 3 points in order to demonstrate clinically significant improvement in balance and decreased risk for falls.     Baseline: 11/27/22: Deferred to visit # 2.      11/29/22: 15/24     12/27/22:12/24.     02/05/23: 12/24   03/05/23: 18/24 Goal status: ACHIEVED   6. Pt will decrease TUG to below 12 seconds/decrease in order to demonstrate decreased fall risk.  Baseline: 11/27/22: 13.6 sec     12/27/22: 11.5 sec Goal status: ACHIEVED   7. Pt will increase by at least 73m (187ft) in order to demonstrate clinically significant improvement in cardiopulmonary endurance and community ambulation  Baseline: 11/27/22: Deferred to visit # 2.   11/29/22: 980 feet.  12/27/22:Deferred  01/01/23: 840 ft.     02/05/23: 1050 ft Goal status: ACHIEVED      PLAN: PT FREQUENCY: 2x/week   PT DURATION: 3-4 weeks   PLANNED INTERVENTIONS: Therapeutic exercises, Therapeutic activity, Neuromuscular re-education, Balance training, Gait training, Patient/Family education, Electrical stimulation   PLAN FOR NEXT SESSION: Continue with drills re-training  obstacle negotiation, sit to stand, and static postural control including narrow BOS/uneven surfaces. Continue with activities requiring acceleration/deceleration during ambulation, dual tasking during gait requiring head turns. Continue with improving ability to perform lunging and floor transfer as needed for getting off of ground during gardening   Consuela Mimes, PT, DPT [phone] 03/27/2023 1:12 PM

## 2023-04-01 ENCOUNTER — Encounter: Payer: Self-pay | Admitting: Physical Therapy

## 2023-04-01 ENCOUNTER — Ambulatory Visit: Payer: Medicare HMO | Admitting: Physical Therapy

## 2023-04-01 DIAGNOSIS — Z9181 History of falling: Secondary | ICD-10-CM | POA: Diagnosis not present

## 2023-04-01 DIAGNOSIS — R262 Difficulty in walking, not elsewhere classified: Secondary | ICD-10-CM

## 2023-04-01 DIAGNOSIS — M6281 Muscle weakness (generalized): Secondary | ICD-10-CM

## 2023-04-01 DIAGNOSIS — R2689 Other abnormalities of gait and mobility: Secondary | ICD-10-CM

## 2023-04-01 NOTE — Therapy (Signed)
OUTPATIENT PHYSICAL THERAPY TREATMENT   Patient Name: Barbara Ballard MRN: [id number] DOB:09-28-1946, 77 y.o., female Today's Date: 04/01/2023   PCP: [facility], [place]  REFERRING PROVIDER: Ardyth Man, PA-C   END OF SESSION:   PT End of Session - 04/01/23 0910     Visit Number 25    Number of Visits 28    Date for PT Re-Evaluation 04/04/23    Authorization Type [employer] 2024    Progress Note Due on Visit 30    PT Start Time 0904    PT Stop Time 0944    PT Time Calculation (min) 40 min    Equipment Utilized During Treatment Gait belt    Activity Tolerance Patient tolerated treatment well    Behavior During Therapy [facility] for tasks assessed/performed                Past Medical History:  Diagnosis Date   Depression    High cholesterol    Hypertension    Restless leg    Past Surgical History:  Procedure Laterality Date   BUNIONECTOMY     CHOLECYSTECTOMY     Patient Active Problem List   Diagnosis Date Noted   Depression 03/21/2022   Essential hypertension 03/21/2022   Hyperlipidemia 03/21/2022   Restless leg syndrome 03/21/2022   Preoperative evaluation to rule out surgical contraindication 07/17/2021   Dermatochalasis of upper and lower eyelids of both eyes 02/23/2021   Diplopia 02/23/2021   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   Nuclear sclerotic cataract of both eyes 02/23/2021   Bilateral sensorineural hearing loss 09/09/2020   Orthostatic hypotension 07/15/2020   Grief 12/23/2019   Hypokalemia 12/23/2019   Osteopenia of spine 08/04/2019   Moderate episode of recurrent major depressive disorder (HCC) 08/12/2018   Dental decay 03/19/2018   Closed displaced fracture of distal phalanx of left middle finger with routine healing 12/24/2016   Fracture of lumbar spine (HCC) 04/11/2014   AK (actinic keratosis) 11/04/2012   Neuropathy 05/05/2012   Personal history of other malignant neoplasm of  skin 04/22/2012    REFERRING DIAG:  R29.6 (ICD-10-CM) - Repeated falls    THERAPY DIAG:  History of falling  Difficulty in walking, not elsewhere classified  Muscle weakness (generalized)  Imbalance  Rationale for Evaluation and Treatment Rehabilitation  PERTINENT HISTORY: Patient is a 77 year old female referred to outpatient PT for frequent falls following hospital discharge (pt was admitted 10/16/22 due to pneumonia and had positive urinalysis for infection). Patient currently reports that her condition has improved, but she still has not gotten her voice back. Pt denies chest pain or SOB with getting around. Pt reports difficulty with balance and fear of falling. She reports unsteadiness on her feet that is worse after prolonged sitting. Pt reports LOB with bending down e.g. doing gardening and trying to get up. She reports no major change since hospitalization with this. Pt reports neuropathy affecting her feet.   Pain: No Numbness/Tingling: Yes, numbness in feet and paresthesias in feet/toes  Focal Weakness: No Recent changes in overall health/medication: Yes, hospitalization in November  Prior history of physical therapy for balance:  Yes, history of PT for balance in 2916  Falls: Has patient fallen in last 6 months? Yes, Number of falls: 3-4 times, non-injurious falls  Directional pattern for falls: Yes, forward  Imaging: No  Prior level of function: Independent Occupational demands: N/A Hobbies: walking, gardening    Red flags (bowel/bladder changes, saddle paresthesia, personal history  of cancer, h/o spinal tumors, h/o compression fx, h/o abdominal aneurysm, abdominal pain, chills/fever, night sweats, nausea, vomiting, unrelenting pain): Positive for history of skin cancer only on patient's chart   Weight Bearing Restrictions: No   Living Environment Lives with: lives alone, her son lives nearby; daughter lives in [place]  Lives in: House/apartment,  home is 2 levels.  She has spiral staircase to get to 2nd floor with handrail on one side. Her bedroom/bathroom is downstairs.  2 steps to get into home at back entrance, handrail on both sides. Has following equipment at home: Single point cane, shower chair, Grab bars, and bars for toilet transfer      Patient Goals: "Strengthen my balance", more endurance     PRECAUTIONS: Fall history     SUBJECTIVE:                                                                                                                                                                                      SUBJECTIVE STATEMENT:  Pt reports no recent falls or near-falls/incidents since last visit. Patient reports no new complaints at arrival.     OBJECTIVE: (objective measures completed at initial evaluation unless otherwise dated)   Patient Surveys (initial eval) FOTO: 64 ,predicted improvement to 70 ABC: 36.9%     GAIT: Distance walked: 150 Assistive device utilized: None Level of assistance: SBA Comments: Decreased arm swing and truncal rigidity, mild forward head and kyphotic posture maintained throughout gait cycle. Pt demonstrates decreased step length and increased cadence. Pt has sound heel strike bilaterally. No pelvic asymmetry or pelvic drop.      Posture: FHRS posture, increased thoracic kyphosis       LE MMT:   MMT (out of 5) Right 11/27/2022 Left 11/27/2022  Hip flexion 4- 4-  Hip extension      Hip abduction (seated) 4 4  Hip adduction (seated)  5 5  Hip internal rotation      Hip external rotation      Knee flexion 5 4+  Knee extension 4+ 5-  Ankle dorsiflexion 4+ 4+  Ankle plantarflexion      Ankle inversion      Ankle eversion      (* = pain; Blank rows = not tested)     Sensation Loss of light touch sensation in bilateral digits of feet      Reflexes Deferred     Cranial Nerves Deferred     Coordination/Cerebellar Finger to Nose: WNL Heel to Shin: WNL Rapid alternating  movements: WNL Finger Opposition: WNL Pronator Drift: Negative     FUNCTIONAL OUTCOME MEASURES -INITIAL EVALUATION    Results Comments  BERG 47/56 Fall risk, in need of intervention  DGI 15/24    TUG 13.63 seconds Fall risk, in need of intervention    5TSTS 18 seconds Fall risk, in need of intervention    6 Minute Walk Test 980 ft    (Blank rows = not tested)         TODAY'S TREATMENT      Neuromuscular Re-education - for improved sensory integration, static and dynamic postural control, equilibrium and non-equilibrium coordination as needed for negotiating home and community environment and stepping over obstacles    (All drills performed with careful CGA)  Stride stance perturbations; x 1 min with RLE in back and LLE in back  Gait with ball toss for dual task; forward and backward stepping in hallway; x4 D/B hallway  Consecutive obstacle negotiations in hallway; (3) 6-inch hurdles, Airex pad, 6-inch step, Airex pad; x3 D/B with step-to pattern in hallway   *next visit* Blue star 8-way multidirectional stepping; with bilat LE, intermittent cueing for technique; x 5 around clock    *not today* Resisted forward stepping with controlled retro step to train tendency for posterior bias, with black tubing in // bars; x5 Toe tapping 6-inch step + Airex pad; 2x10 Cone reaching, pt reaching outside of BOS to challenge static postural control; 2x10 cones with L and R hand High knee with opposite knee tap; 2x D/B Forward and retro stepping; 5x D/B Standing feet together on Airex; 2x30 sec Stride stance with perturbations, RLE in back and LLE in back; x 1 minute each Stride stance ; 3x30 sec in each position (RLE in back and LLE in back) Sidestepping length // bars; 5x D/B Hurdle step over 5-lb Dbell; 2x10 with each LE  -heavy cueing for heel to toe stepping; cued for decreased upper limb support   Therapeutic Exercise - improved strength as needed to improve performance  of CKC activities/functional movements and as needed for power production to prevent fall during episode of large postural perturbation  NuStep; 4 minutes; Level 5, seat at 7, arms at 8  -subjective information gathered during this time  Sit to stand with Airex under feet; 2x10 with ball overhead reach  Forward dynamic lunge in parallel bars; 4x D/B length of bars     PATIENT EDUCATION: Discussed current progress and role of PT moving toward end of POC    *not today* Floor transfer, mat on floor adjacent to low mat table; x 5  -patient has difficulty initiating lift from floor from half kneeling position TRX reverse lunge, partial range; 1x10 with each LE Minilunge adjacent to treadmill armrest; reviewed for HEP -safe alternative for HEP Ambulate laps around gym; x 3 laps at beginning of session   3-way hip in standing; BUE support in parallel bars, with 4-lb ankle weights; 2x10 each direction bilaterally    PATIENT EDUCATION:  Education details: HEP review Person educated: Patient Education method: Explanation and Handouts Education comprehension: verbalized understanding     HOME EXERCISE PROGRAM: Access Code: [email or web address] URL: [email or web address] Date: 12/04/2022 Prepared by: Consuela Mimes  Exercises - Sit to Stand with Counter Support  - 2 x daily - 7 x weekly - 2 sets - 10 reps - Heel Toe Raises with Counter Support  - 2 x daily - 7 x weekly - 2 sets - 10 reps - Standing March with Counter Support  - 2 x daily - 7 x weekly - 2 sets - 10 reps - Standing Hip Abduction with Unilateral Counter Support  -  2 x daily - 7 x weekly - 2 sets - 10 reps     ASSESSMENT:   CLINICAL IMPRESSION: Patient is able to resume full session today and is notably challenged with stepping tasks and tends to fall posteriorly when completing righting reactions to re-gain her postural control. Patient has not experienced further falls in her garden. She uses AD and stool for  safety with completing work close to ground. Patient has progressed significantly, but sensory changes in her LE do still create challenges with stepping and negotiating obstacles. Pt has remaining deficits in postural control, lower extremity strength, equilibrium lower limb coordination, and impaired proprioception/kinesthetic sense of lower limbs associated with neuropathy. Patient will benefit from continued skilled therapeutic intervention to address the above deficits as needed for improved function and QoL.     REHAB POTENTIAL: Good   CLINICAL DECISION MAKING: Evolving/moderate complexity   EVALUATION COMPLEXITY: High     GOALS: Goals reviewed with patient? No   SHORT TERM GOALS: Target date: 12/18/2022   Pt will be independent with HEP in order to improve strength and balance in order to decrease fall risk and improve function at home. Baseline: 11/27/22: Baseline HEP initiated Goal status: ACHIEVED     LONG TERM GOALS: Target date: 01/22/2023   Pt will increase FOTO to at least 70 to demonstrate significant improvement in function at home related to balance  Baseline: 11/27/22: 64.      12/27/22: 50.     02/05/23: 69/70   03/05/23: 59/70 Goal status: IN PROGRESS   2.  Pt will improve BERG by at least 3 points in order to demonstrate clinically significant improvement in balance.   Baseline: 11/27/22: BERG 47/56.     12/27/22: BERG 47/56.     02/05/23:  BERG 48/56.   03/05/23: BERG 46/56 Goal status: ON-GOING   3.  Pt will improve ABC by at least 13% in order to demonstrate clinically significant improvement in balance confidence.      Baseline: 11/27/22: 36.9%  12/27/22 : 45.6%.    02/05/23: 69.4% Goal status: ACHIEVED   4. Pt will decrease 5TSTS by at least 3 seconds in order to demonstrate clinically significant improvement in LE strength      Baseline: 11/27/22: 18 sec  12/27/22: 17.12 sec.  02/05/23: 10.4 Goal status: ACHIEVED    5. Pt will improve DGI by at least 3 points in order to  demonstrate clinically significant improvement in balance and decreased risk for falls.     Baseline: 11/27/22: Deferred to visit # 2.      11/29/22: 15/24     12/27/22:12/24.     02/05/23: 12/24   03/05/23: 18/24 Goal status: ACHIEVED   6. Pt will decrease TUG to below 12 seconds/decrease in order to demonstrate decreased fall risk.  Baseline: 11/27/22: 13.6 sec     12/27/22: 11.5 sec Goal status: ACHIEVED   7. Pt will increase by at least 30m (197ft) in order to demonstrate clinically significant improvement in cardiopulmonary endurance and community ambulation  Baseline: 11/27/22: Deferred to visit # 2.   11/29/22: 980 feet.  12/27/22:Deferred  01/01/23: 840 ft.     02/05/23: 1050 ft Goal status: ACHIEVED      PLAN: PT FREQUENCY: 2x/week   PT DURATION: 3-4 weeks   PLANNED INTERVENTIONS: Therapeutic exercises, Therapeutic activity, Neuromuscular re-education, Balance training, Gait training, Patient/Family education, Electrical stimulation   PLAN FOR NEXT SESSION: Continue with drills re-training obstacle negotiation, sit to stand, and static postural  control including narrow BOS/uneven surfaces. Continue with activities requiring acceleration/deceleration during ambulation, dual tasking during gait requiring head turns. Continue with improving ability to perform lunging and floor transfer as needed for getting off of ground during gardening   Consuela Mimes, PT, DPT [phone] 04/01/2023 9:10 AM

## 2023-04-04 ENCOUNTER — Ambulatory Visit: Payer: Medicare HMO | Admitting: Physical Therapy

## 2023-04-04 DIAGNOSIS — M6281 Muscle weakness (generalized): Secondary | ICD-10-CM

## 2023-04-04 DIAGNOSIS — Z9181 History of falling: Secondary | ICD-10-CM

## 2023-04-04 DIAGNOSIS — R262 Difficulty in walking, not elsewhere classified: Secondary | ICD-10-CM

## 2023-04-04 DIAGNOSIS — R2689 Other abnormalities of gait and mobility: Secondary | ICD-10-CM

## 2023-04-04 NOTE — Therapy (Signed)
OUTPATIENT PHYSICAL THERAPY TREATMENT/GOAL UPDATE   Patient Name: Barbara Ballard MRN: [id number] DOB:1946/06/30, 77 y.o., female Today's Date: 04/04/2023   PCP: [facility], [place]  REFERRING PROVIDER: Ardyth Man, PA-C   END OF SESSION:   PT End of Session - 04/04/23 1604     Visit Number 26    Number of Visits 28    Date for PT Re-Evaluation 04/04/23    Authorization Type [employer] 2024    Progress Note Due on Visit 30    PT Start Time 1557    PT Stop Time 1639    PT Time Calculation (min) 42 min    Equipment Utilized During Treatment Gait belt    Activity Tolerance Patient tolerated treatment well    Behavior During Therapy WFL for tasks assessed/performed                 Past Medical History:  Diagnosis Date   Depression    High cholesterol    Hypertension    Restless leg    Past Surgical History:  Procedure Laterality Date   BUNIONECTOMY     CHOLECYSTECTOMY     Patient Active Problem List   Diagnosis Date Noted   Depression 03/21/2022   Essential hypertension 03/21/2022   Hyperlipidemia 03/21/2022   Restless leg syndrome 03/21/2022   Preoperative evaluation to rule out surgical contraindication 07/17/2021   Dermatochalasis of upper and lower eyelids of both eyes 02/23/2021   Diplopia 02/23/2021   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   Nuclear sclerotic cataract of both eyes 02/23/2021   Bilateral sensorineural hearing loss 09/09/2020   Orthostatic hypotension 07/15/2020   Grief 12/23/2019   Hypokalemia 12/23/2019   Osteopenia of spine 08/04/2019   Moderate episode of recurrent major depressive disorder (HCC) 08/12/2018   Dental decay 03/19/2018   Closed displaced fracture of distal phalanx of left middle finger with routine healing 12/24/2016   Fracture of lumbar spine (HCC) 04/11/2014   AK (actinic keratosis) 11/04/2012   Neuropathy 05/05/2012   Personal history of other  malignant neoplasm of skin 04/22/2012    REFERRING DIAG:  R29.6 (ICD-10-CM) - Repeated falls    THERAPY DIAG:  History of falling  Difficulty in walking, not elsewhere classified  Muscle weakness (generalized)  Imbalance  Rationale for Evaluation and Treatment Rehabilitation  PERTINENT HISTORY: Patient is a 77 year old female referred to outpatient PT for frequent falls following hospital discharge (pt was admitted 10/16/22 due to pneumonia and had positive urinalysis for infection). Patient currently reports that her condition has improved, but she still has not gotten her voice back. Pt denies chest pain or SOB with getting around. Pt reports difficulty with balance and fear of falling. She reports unsteadiness on her feet that is worse after prolonged sitting. Pt reports LOB with bending down e.g. doing gardening and trying to get up. She reports no major change since hospitalization with this. Pt reports neuropathy affecting her feet.   Pain: No Numbness/Tingling: Yes, numbness in feet and paresthesias in feet/toes  Focal Weakness: No Recent changes in overall health/medication: Yes, hospitalization in November  Prior history of physical therapy for balance:  Yes, history of PT for balance in 2916  Falls: Has patient fallen in last 6 months? Yes, Number of falls: 3-4 times, non-injurious falls  Directional pattern for falls: Yes, forward  Imaging: No  Prior level of function: Independent Occupational demands: N/A Hobbies: walking, gardening    Red flags (bowel/bladder changes, saddle paresthesia,  personal history of cancer, h/o spinal tumors, h/o compression fx, h/o abdominal aneurysm, abdominal pain, chills/fever, night sweats, nausea, vomiting, unrelenting pain): Positive for history of skin cancer only on patient's chart   Weight Bearing Restrictions: No   Living Environment Lives with: lives alone, her son lives nearby; daughter lives in [place]  Lives in:  House/apartment,  home is 2 levels. She has spiral staircase to get to 2nd floor with handrail on one side. Her bedroom/bathroom is downstairs.  2 steps to get into home at back entrance, handrail on both sides. Has following equipment at home: Single point cane, shower chair, Grab bars, and bars for toilet transfer      Patient Goals: "Strengthen my balance", more endurance     PRECAUTIONS: Fall history     SUBJECTIVE:                                                                                                                                                                                      SUBJECTIVE STATEMENT:  Pt reports no recent falls or near-falls/incidents since last visit. Patient reports no new complaints at arrival. She feels that her balance has improved, but she does still have some LOB when walking on uneven terrain and with negotiating obstacles. Pt has been complaint with home program.     OBJECTIVE: (objective measures completed at initial evaluation unless otherwise dated)   Patient Surveys (initial eval) FOTO: 64 ,predicted improvement to 70 ABC: 36.9%     GAIT: Distance walked: 150 Assistive device utilized: None Level of assistance: SBA Comments: Decreased arm swing and truncal rigidity, mild forward head and kyphotic posture maintained throughout gait cycle. Pt demonstrates decreased step length and increased cadence. Pt has sound heel strike bilaterally. No pelvic asymmetry or pelvic drop.      Posture: FHRS posture, increased thoracic kyphosis       LE MMT:   MMT (out of 5) Right 11/27/2022 Left 11/27/2022  Hip flexion 4- 4-  Hip extension      Hip abduction (seated) 4 4  Hip adduction (seated)  5 5  Hip internal rotation      Hip external rotation      Knee flexion 5 4+  Knee extension 4+ 5-  Ankle dorsiflexion 4+ 4+  Ankle plantarflexion      Ankle inversion      Ankle eversion      (* = pain; Blank rows = not tested)      Sensation Loss of light touch sensation in bilateral digits of feet      Reflexes Deferred     Cranial Nerves Deferred     Coordination/Cerebellar Finger  to Nose: WNL Heel to Shin: WNL Rapid alternating movements: WNL Finger Opposition: WNL Pronator Drift: Negative     FUNCTIONAL OUTCOME MEASURES -INITIAL EVALUATION    Results Comments  BERG 47/56 Fall risk, in need of intervention  DGI 15/24    TUG 13.63 seconds Fall risk, in need of intervention    5TSTS 18 seconds Fall risk, in need of intervention    6 Minute Walk Test 980 ft    (Blank rows = not tested)         TODAY'S TREATMENT      Neuromuscular Re-education - for improved sensory integration, static and dynamic postural control, equilibrium and non-equilibrium coordination as needed for negotiating home and community environment and stepping over obstacles    (All drills performed with careful CGA)  *next visit* Consecutive obstacle negotiations in hallway; (3) 6-inch hurdles, Airex pad, 6-inch step, Airex pad; x3 D/B with step-to pattern in hallway  Blue star 8-way multidirectional stepping; with bilat LE, intermittent cueing for technique; x 5 around clock    *not today* Stride stance perturbations; x 1 min with RLE in back and LLE in back Gait with ball toss for dual task; forward and backward stepping in hallway; x4 D/B hallway Resisted forward stepping with controlled retro step to train tendency for posterior bias, with black tubing in // bars; x5 Toe tapping 6-inch step + Airex pad; 2x10 Cone reaching, pt reaching outside of BOS to challenge static postural control; 2x10 cones with L and R hand High knee with opposite knee tap; 2x D/B Forward and retro stepping; 5x D/B Standing feet together on Airex; 2x30 sec Stride stance with perturbations, RLE in back and LLE in back; x 1 minute each Stride stance ; 3x30 sec in each position (RLE in back and LLE in back) Sidestepping length // bars;  5x D/B Hurdle step over 5-lb Dbell; 2x10 with each LE  -heavy cueing for heel to toe stepping; cued for decreased upper limb support   Therapeutic Exercise - improved strength as needed to improve performance of CKC activities/functional movements and as needed for power production to prevent fall during episode of large postural perturbation  *GOAL UPDATE PERFORMED   NuStep; 4 minutes; Level 5, seat at 7, arms at 8  -subjective information gathered during this time  Sit to stand with Airex under feet; 2x10 with ball overhead reach  Forward dynamic lunge in parallel bars; 4x D/B length of bars     PATIENT EDUCATION: Discussed current progress and role of PT moving toward end of POC    *not today* Floor transfer, mat on floor adjacent to low mat table; x 5  -patient has difficulty initiating lift from floor from half kneeling position TRX reverse lunge, partial range; 1x10 with each LE Minilunge adjacent to treadmill armrest; reviewed for HEP -safe alternative for HEP Ambulate laps around gym; x 3 laps at beginning of session   3-way hip in standing; BUE support in parallel bars, with 4-lb ankle weights; 2x10 each direction bilaterally    PATIENT EDUCATION:  Education details: HEP review Person educated: Patient Education method: Explanation and Handouts Education comprehension: verbalized understanding     HOME EXERCISE PROGRAM: Access Code: [email or web address] URL: [email or web address] Date: 12/04/2022 Prepared by: Consuela Mimes  Exercises - Sit to Stand with Counter Support  - 2 x daily - 7 x weekly - 2 sets - 10 reps - Heel Toe Raises with Counter Support  - 2 x daily - 7 x weekly - 2  sets - 10 reps - Standing March with Counter Support  - 2 x daily - 7 x weekly - 2 sets - 10 reps - Standing Hip Abduction with Unilateral Counter Support  - 2 x daily - 7 x weekly - 2 sets - 10 reps     ASSESSMENT:   CLINICAL IMPRESSION: Today's session focused largely  on goal updating. Patient has made notable progress to date with most of her performance-based goals met. She has met her FOTO goal. Pt has not yet met her BERG goal and has not shown significant change in this measure, though this is largely attributed to relatively high baseline score. Pt is notably challenged with standing with narrow BOS, toe tapping, and unipedal stance portions of BERG. She has completed her gardening more safely and uses stool and AD to walk around and complete work in her garden. Pt has remaining deficits in postural control, lower extremity strength, equilibrium lower limb coordination, and impaired proprioception/kinesthetic sense of lower limbs associated with neuropathy. Patient will benefit from continued skilled therapeutic intervention to address the above deficits as needed for improved function and QoL.     REHAB POTENTIAL: Good   CLINICAL DECISION MAKING: Evolving/moderate complexity   EVALUATION COMPLEXITY: High     GOALS: Goals reviewed with patient? No   SHORT TERM GOALS: Target date: 12/18/2022   Pt will be independent with HEP in order to improve strength and balance in order to decrease fall risk and improve function at home. Baseline: 11/27/22: Baseline HEP initiated Goal status: ACHIEVED     LONG TERM GOALS: Target date: 01/22/2023   Pt will increase FOTO to at least 70 to demonstrate significant improvement in function at home related to balance  Baseline: 11/27/22: 64.      12/27/22: 50.     02/05/23: 69/70   03/05/23: 59/70.   04/04/23: 78/70 Goal status: ACHIEVED   2.  Pt will improve BERG by at least 3 points in order to demonstrate clinically significant improvement in balance.   Baseline: 11/27/22: BERG 47/56.     12/27/22: BERG 47/56.     02/05/23:  BERG 48/56.   03/05/23: BERG 46/56    04/04/23: 47/56 Goal status: ON-GOING   3.  Pt will improve ABC by at least 13% in order to demonstrate clinically significant improvement in balance confidence.       Baseline: 11/27/22: 36.9%  12/27/22 : 45.6%.    02/05/23: 69.4% Goal status: ACHIEVED   4. Pt will decrease 5TSTS by at least 3 seconds in order to demonstrate clinically significant improvement in LE strength      Baseline: 11/27/22: 18 sec  12/27/22: 17.12 sec.  02/05/23: 10.4 Goal status: ACHIEVED    5. Pt will improve DGI by at least 3 points in order to demonstrate clinically significant improvement in balance and decreased risk for falls.     Baseline: 11/27/22: Deferred to visit # 2.      11/29/22: 15/24     12/27/22:12/24.     02/05/23: 12/24   03/05/23: 18/24 Goal status: ACHIEVED   6. Pt will decrease TUG to below 12 seconds/decrease in order to demonstrate decreased fall risk.  Baseline: 11/27/22: 13.6 sec     12/27/22: 11.5 sec Goal status: ACHIEVED   7. Pt will increase by at least 55m (186ft) in order to demonstrate clinically significant improvement in cardiopulmonary endurance and community ambulation  Baseline: 11/27/22: Deferred to visit # 2.   11/29/22: 980 feet.  12/27/22:Deferred  01/01/23: 840 ft.     02/05/23: 1050 ft Goal status: ACHIEVED      PLAN: PT FREQUENCY: 2x/week   PT DURATION: 3-4 weeks   PLANNED INTERVENTIONS: Therapeutic exercises, Therapeutic activity, Neuromuscular re-education, Balance training, Gait training, Patient/Family education, Electrical stimulation   PLAN FOR NEXT SESSION: Continue with drills re-training obstacle negotiation, sit to stand, and static postural control including narrow BOS/uneven surfaces. Continue with activities requiring acceleration/deceleration during ambulation, dual tasking during gait requiring head turns. Continue with improving ability to perform lunging and floor transfer as needed for getting off of ground during gardening   Consuela Mimes, PT, [place] #[id number] 04/04/2023 4:05 PM

## 2023-04-08 ENCOUNTER — Ambulatory Visit: Payer: Medicare HMO | Admitting: Physical Therapy

## 2023-04-08 ENCOUNTER — Encounter: Payer: Self-pay | Admitting: Physical Therapy

## 2023-04-08 DIAGNOSIS — Z9181 History of falling: Secondary | ICD-10-CM

## 2023-04-08 DIAGNOSIS — M6281 Muscle weakness (generalized): Secondary | ICD-10-CM

## 2023-04-08 DIAGNOSIS — R262 Difficulty in walking, not elsewhere classified: Secondary | ICD-10-CM

## 2023-04-08 DIAGNOSIS — R2689 Other abnormalities of gait and mobility: Secondary | ICD-10-CM

## 2023-04-08 NOTE — Therapy (Signed)
OUTPATIENT PHYSICAL THERAPY TREATMENT   Patient Name: Barbara Ballard MRN: [id number] DOB:1946-06-16, 77 y.o., female Today's Date: 04/08/2023   PCP: [facility], [place]  REFERRING PROVIDER: Ardyth Man, PA-C   END OF SESSION:   PT End of Session - 04/08/23 0902     Visit Number 27    Number of Visits 29    Date for PT Re-Evaluation 04/04/23    Authorization Type [employer] 2024    Progress Note Due on Visit 30    PT Start Time 0902    PT Stop Time 0946    PT Time Calculation (min) 44 min    Equipment Utilized During Treatment Gait belt    Activity Tolerance Patient tolerated treatment well    Behavior During Therapy [facility] for tasks assessed/performed             Past Medical History:  Diagnosis Date   Depression    High cholesterol    Hypertension    Restless leg    Past Surgical History:  Procedure Laterality Date   BUNIONECTOMY     CHOLECYSTECTOMY     Patient Active Problem List   Diagnosis Date Noted   Depression 03/21/2022   Essential hypertension 03/21/2022   Hyperlipidemia 03/21/2022   Restless leg syndrome 03/21/2022   Preoperative evaluation to rule out surgical contraindication 07/17/2021   Dermatochalasis of upper and lower eyelids of both eyes 02/23/2021   Diplopia 02/23/2021   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   Nuclear sclerotic cataract of both eyes 02/23/2021   Bilateral sensorineural hearing loss 09/09/2020   Orthostatic hypotension 07/15/2020   Grief 12/23/2019   Hypokalemia 12/23/2019   Osteopenia of spine 08/04/2019   Moderate episode of recurrent major depressive disorder (HCC) 08/12/2018   Dental decay 03/19/2018   Closed displaced fracture of distal phalanx of left middle finger with routine healing 12/24/2016   Fracture of lumbar spine (HCC) 04/11/2014   AK (actinic keratosis) 11/04/2012   Neuropathy 05/05/2012   Personal history of other malignant neoplasm of skin  04/22/2012    REFERRING DIAG:  R29.6 (ICD-10-CM) - Repeated falls    THERAPY DIAG:  History of falling  Difficulty in walking, not elsewhere classified  Muscle weakness (generalized)  Imbalance  Rationale for Evaluation and Treatment Rehabilitation  PERTINENT HISTORY: Patient is a 77 year old female referred to outpatient PT for frequent falls following hospital discharge (pt was admitted 10/16/22 due to pneumonia and had positive urinalysis for infection). Patient currently reports that her condition has improved, but she still has not gotten her voice back. Pt denies chest pain or SOB with getting around. Pt reports difficulty with balance and fear of falling. She reports unsteadiness on her feet that is worse after prolonged sitting. Pt reports LOB with bending down e.g. doing gardening and trying to get up. She reports no major change since hospitalization with this. Pt reports neuropathy affecting her feet.   Pain: No Numbness/Tingling: Yes, numbness in feet and paresthesias in feet/toes  Focal Weakness: No Recent changes in overall health/medication: Yes, hospitalization in November  Prior history of physical therapy for balance:  Yes, history of PT for balance in 2916  Falls: Has patient fallen in last 6 months? Yes, Number of falls: 3-4 times, non-injurious falls  Directional pattern for falls: Yes, forward  Imaging: No  Prior level of function: Independent Occupational demands: N/A Hobbies: walking, gardening    Red flags (bowel/bladder changes, saddle paresthesia, personal history of cancer, h/o  spinal tumors, h/o compression fx, h/o abdominal aneurysm, abdominal pain, chills/fever, night sweats, nausea, vomiting, unrelenting pain): Positive for history of skin cancer only on patient's chart   Weight Bearing Restrictions: No   Living Environment Lives with: lives alone, her son lives nearby; daughter lives in [place]  Lives in: House/apartment,  home is 2 levels. She  has spiral staircase to get to 2nd floor with handrail on one side. Her bedroom/bathroom is downstairs.  2 steps to get into home at back entrance, handrail on both sides. Has following equipment at home: Single point cane, shower chair, Grab bars, and bars for toilet transfer      Patient Goals: "Strengthen my balance", more endurance     PRECAUTIONS: Fall history     SUBJECTIVE:                                                                                                                                                                                      SUBJECTIVE STATEMENT:  Pt reports no recent falls or major incidents in her home or garden. Patient reports doing well with her HEP.     OBJECTIVE: (objective measures completed at initial evaluation unless otherwise dated)   Patient Surveys (initial eval) FOTO: 64 ,predicted improvement to 70 ABC: 36.9%     GAIT: Distance walked: 150 Assistive device utilized: None Level of assistance: SBA Comments: Decreased arm swing and truncal rigidity, mild forward head and kyphotic posture maintained throughout gait cycle. Pt demonstrates decreased step length and increased cadence. Pt has sound heel strike bilaterally. No pelvic asymmetry or pelvic drop.      Posture: FHRS posture, increased thoracic kyphosis       LE MMT:   MMT (out of 5) Right 11/27/2022 Left 11/27/2022  Hip flexion 4- 4-  Hip extension      Hip abduction (seated) 4 4  Hip adduction (seated)  5 5  Hip internal rotation      Hip external rotation      Knee flexion 5 4+  Knee extension 4+ 5-  Ankle dorsiflexion 4+ 4+  Ankle plantarflexion      Ankle inversion      Ankle eversion      (* = pain; Blank rows = not tested)     Sensation Loss of light touch sensation in bilateral digits of feet      Reflexes Deferred     Cranial Nerves Deferred     Coordination/Cerebellar Finger to Nose: WNL Heel to Shin: WNL Rapid alternating movements:  WNL Finger Opposition: WNL Pronator Drift: Negative     FUNCTIONAL OUTCOME MEASURES -INITIAL EVALUATION    Results Comments  BERG 47/56 Fall risk, in need of intervention  DGI 15/24    TUG 13.63 seconds Fall risk, in need of intervention    5TSTS 18 seconds Fall risk, in need of intervention    6 Minute Walk Test 980 ft    (Blank rows = not tested)         TODAY'S TREATMENT      Neuromuscular Re-education - for improved sensory integration, static and dynamic postural control, equilibrium and non-equilibrium coordination as needed for negotiating home and community environment and stepping over obstacles    (All drills performed with careful CGA)  High knee with opposite knee tap; blue agility ladder; 4x D/B  Blue star 8-way multidirectional stepping; with bilat LE, intermittent cueing for technique; x 10 around clock   Consecutive obstacle negotiations in hallway; (3) 6-inch hurdles, Airex pad, 6-inch step, Airex pad; x3 D/B with step-to pattern in hallway  -pt has heavy posterior bias/posterior LOB and leans onto therapist's hands on small of back; heavy cueing to shift weight forward versus leaning backward  Resisted forward stepping with controlled retro step to train tendency for posterior bias, with black tubing in // bars; x5   *not today* Stride stance perturbations; x 1 min with RLE in back and LLE in back Gait with ball toss for dual task; forward and backward stepping in hallway; x4 D/B hallway Toe tapping 6-inch step + Airex pad; 2x10 Cone reaching, pt reaching outside of BOS to challenge static postural control; 2x10 cones with L and R hand Forward and retro stepping; 5x D/B Standing feet together on Airex; 2x30 sec Stride stance with perturbations, RLE in back and LLE in back; x 1 minute each Stride stance ; 3x30 sec in each position (RLE in back and LLE in back) Sidestepping length // bars; 5x D/B Hurdle step over 5-lb Dbell; 2x10 with each LE  -heavy  cueing for heel to toe stepping; cued for decreased upper limb support   Therapeutic Exercise - improved strength as needed to improve performance of CKC activities/functional movements and as needed for power production to prevent fall during episode of large postural perturbation   NuStep; 5 minutes; Level 5, seat at 7, arms at 8  -subjective information gathered during this time  Sit to stand with Airex under feet; 2x10 with 6-lb Medball overhead reach  Forward dynamic lunge in parallel bars; 4x D/B length of bars     PATIENT EDUCATION: Discussed current progress and role of PT moving toward end of POC    *not today* Floor transfer, mat on floor adjacent to low mat table; x 5  -patient has difficulty initiating lift from floor from half kneeling position TRX reverse lunge, partial range; 1x10 with each LE Minilunge adjacent to treadmill armrest; reviewed for HEP -safe alternative for HEP Ambulate laps around gym; x 3 laps at beginning of session   3-way hip in standing; BUE support in parallel bars, with 4-lb ankle weights; 2x10 each direction bilaterally    PATIENT EDUCATION:  Education details: HEP review Person educated: Patient Education method: Explanation and Handouts Education comprehension: verbalized understanding     HOME EXERCISE PROGRAM: Access Code: [email or web address] URL: [email or web address] Date: 12/04/2022 Prepared by: Consuela Mimes  Exercises - Sit to Stand with Counter Support  - 2 x daily - 7 x weekly - 2 sets - 10 reps - Heel Toe Raises with Counter Support  - 2 x daily - 7 x weekly - 2 sets - 10 reps - Standing March  with Counter Support  - 2 x daily - 7 x weekly - 2 sets - 10 reps - Standing Hip Abduction with Unilateral Counter Support  - 2 x daily - 7 x weekly - 2 sets - 10 reps     ASSESSMENT:   CLINICAL IMPRESSION: Patient is significantly challenged with hurdle stepping and step up/step down with pt tending to lose balance and  stumble posteriorly. Pt leans heavy onto therapist's hands resting against her back and will readily fall backward without Mod to MaxA from therapist on small of back to prevent posterior fall. Pt is able to perform other tasks without significant posterior LOB including resisted stepping forward with posterior perturbation in parallel bars and sit to stand with overhead reach on uneven surface. Pt is most challenged with dynamic stepping task requiring narrow BOS and fleeting unipedal stance given impaired postural control and diminished LE sensation. Pt has made excellent progress with PT goals, but she does have persisting impairments with imbalance; pt will likely need to use appropriate AD for novel scenarios and environments when ambulating. Pt has remaining deficits in postural control, lower extremity strength, equilibrium lower limb coordination, and impaired proprioception/kinesthetic sense of lower limbs associated with neuropathy. Patient will benefit from continued skilled therapeutic intervention to address the above deficits as needed for improved function and QoL.     REHAB POTENTIAL: Good   CLINICAL DECISION MAKING: Evolving/moderate complexity   EVALUATION COMPLEXITY: High     GOALS: Goals reviewed with patient? No   SHORT TERM GOALS: Target date: 12/18/2022   Pt will be independent with HEP in order to improve strength and balance in order to decrease fall risk and improve function at home. Baseline: 11/27/22: Baseline HEP initiated Goal status: ACHIEVED     LONG TERM GOALS: Target date: 01/22/2023   Pt will increase FOTO to at least 70 to demonstrate significant improvement in function at home related to balance  Baseline: 11/27/22: 64.      12/27/22: 50.     02/05/23: 69/70   03/05/23: 59/70.   04/04/23: 78/70 Goal status: ACHIEVED   2.  Pt will improve BERG by at least 3 points in order to demonstrate clinically significant improvement in balance.   Baseline: 11/27/22: BERG 47/56.      12/27/22: BERG 47/56.     02/05/23:  BERG 48/56.   03/05/23: BERG 46/56    04/04/23: 47/56 Goal status: ON-GOING   3.  Pt will improve ABC by at least 13% in order to demonstrate clinically significant improvement in balance confidence.      Baseline: 11/27/22: 36.9%  12/27/22 : 45.6%.    02/05/23: 69.4% Goal status: ACHIEVED   4. Pt will decrease 5TSTS by at least 3 seconds in order to demonstrate clinically significant improvement in LE strength      Baseline: 11/27/22: 18 sec  12/27/22: 17.12 sec.  02/05/23: 10.4 Goal status: ACHIEVED    5. Pt will improve DGI by at least 3 points in order to demonstrate clinically significant improvement in balance and decreased risk for falls.     Baseline: 11/27/22: Deferred to visit # 2.      11/29/22: 15/24     12/27/22:12/24.     02/05/23: 12/24   03/05/23: 18/24 Goal status: ACHIEVED   6. Pt will decrease TUG to below 12 seconds/decrease in order to demonstrate decreased fall risk.  Baseline: 11/27/22: 13.6 sec     12/27/22: 11.5 sec Goal status: ACHIEVED   7. Pt will  increase by at least 43m (169ft) in order to demonstrate clinically significant improvement in cardiopulmonary endurance and community ambulation  Baseline: 11/27/22: Deferred to visit # 2.   11/29/22: 980 feet.  12/27/22:Deferred  01/01/23: 840 ft.     02/05/23: 1050 ft Goal status: ACHIEVED      PLAN: PT FREQUENCY: 2x/week   PT DURATION: 3-4 weeks   PLANNED INTERVENTIONS: Therapeutic exercises, Therapeutic activity, Neuromuscular re-education, Balance training, Gait training, Patient/Family education, Electrical stimulation   PLAN FOR NEXT SESSION: Continue with drills re-training obstacle negotiation, sit to stand, and static postural control including narrow BOS/uneven surfaces. Continue with activities requiring acceleration/deceleration during ambulation, dual tasking during gait requiring head turns. Continue with improving ability to perform lunging and floor transfer as needed for getting off of  ground during gardening   Consuela Mimes, PT, [place] #[id number] 04/09/2023 7:49 AM

## 2023-04-11 ENCOUNTER — Ambulatory Visit: Payer: Medicare HMO | Admitting: Physical Therapy

## 2023-04-11 ENCOUNTER — Ambulatory Visit (INDEPENDENT_AMBULATORY_CARE_PROVIDER_SITE_OTHER): Payer: Medicare HMO

## 2023-04-11 ENCOUNTER — Ambulatory Visit
Admission: EM | Admit: 2023-04-11 | Discharge: 2023-04-11 | Disposition: A | Discharge status: Home / Self Care | Payer: Medicare HMO | Attending: Emergency Medicine | Admitting: Emergency Medicine

## 2023-04-11 DIAGNOSIS — S52502A Unspecified fracture of the lower end of left radius, initial encounter for closed fracture: Secondary | ICD-10-CM

## 2023-04-11 NOTE — ED Triage Notes (Signed)
Pt c/o L arm pain due to fall that occurred this AM. States she was para washing something on the deck & was backing up & tripped over hose.

## 2023-04-11 NOTE — Discharge Instructions (Addendum)
Your x-rays today show that you have a fracture of your distal radius as a result of your fall this morning.  We have placed you in a splint to help protect your wrist from further injury and to keep the bones immobile so it decreases your pain.  Wear the splint at all times.  Use the sling that we have provided to help to support the weight of the splint during the day.  Do not wear it to bed at night.  You may apply ice on top of the splint for torments at a time 2-3 times a day to help with pain and swelling.  Keep your left arm elevated is much as possible to help decrease swelling and aid in pain relief as well.  You may also take over-the-counter Tylenol and/or ibuprofen according to the package instructions as needed for pain and inflammation.  I given the contact information for Dr. Signa Kell at [facility] orthopedics.  Please call his office and schedule an appointment for next week to be reevaluated for the fracture and determine if you need to have a cast placed.  If you develop any increased pain, swelling, or numbness and tingling in her fingers either return for reevaluation or seek care in the ER.

## 2023-04-11 NOTE — ED Provider Notes (Signed)
[facility]    CSN: [id number] Arrival date & time: 04/11/23  1118      History   Chief Complaint Chief Complaint  Patient presents with   Fall   Arm Pain    HPI Barbara Ballard is a 77 y.o. female.   HPI  77 year old female with a past medical history of essential hypertension, high cholesterol, depression, and restless leg syndrome presents for evaluation of left arm pain after suffering a ground-level fall this morning.  She reports that she was using her power washer and she stepped back tripping over the hose.  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   Nuclear sclerotic cataract of both eyes 02/23/2021   Bilateral sensorineural hearing loss 09/09/2020   Orthostatic hypotension 07/15/2020   Grief 12/23/2019   Hypokalemia 12/23/2019   Osteopenia of spine 08/04/2019   Moderate episode of recurrent major depressive disorder (HCC) 08/12/2018   Dental decay 03/19/2018   Closed displaced fracture of distal phalanx of left middle finger with routine healing 12/24/2016   Fracture of lumbar spine (HCC) 04/11/2014   AK (actinic keratosis) 11/04/2012   Neuropathy 05/05/2012   Personal history of other malignant neoplasm of skin 04/22/2012    Past Surgical History:  Procedure Laterality Date   BUNIONECTOMY     CHOLECYSTECTOMY      OB History   No obstetric history on file.      Home Medications    Prior to Admission medications   Medication Sig Start Date  End Date Taking? Authorizing Provider  aspirin 81 MG chewable tablet Chew by mouth daily.   Yes [provider]  aspirin 81 MG EC tablet Take by mouth.   Yes [provider]  buPROPion (WELLBUTRIN SR) 150 MG 12 hr tablet Take 150 mg by mouth 2 (two) times daily.   Yes [provider]  buPROPion (WELLBUTRIN SR) 150 MG 12 hr tablet Take by mouth.   Yes [provider]  buPROPion (WELLBUTRIN XL) 300 MG 24 hr tablet Take by mouth. 01/31/22 04/11/23 Yes [provider]  chlorthalidone (HYGROTON) 25 MG tablet Take 1 tablet by mouth daily. 05/18/21 04/11/23 Yes [provider]  Cholecalciferol 50 MCG (2000 UT) TABS Take by mouth.   Yes [provider]  FLUoxetine (PROZAC) 40 MG capsule Take 40 mg by mouth daily.   Yes [provider]  FLUoxetine (PROZAC) 40 MG capsule Take by mouth.   Yes [provider]  lisinopril (PRINIVIL,ZESTRIL) 40 MG tablet Take 40 mg by mouth daily.   Yes [provider]  lisinopril (ZESTRIL) 40 MG tablet Take by mouth.   Yes [provider]  losartan (COZAAR) 25 MG tablet Take 1 tablet by mouth daily. 01/08/22 04/11/23 Yes [provider]  potassium chloride (KLOR-CON) 10 MEQ tablet Take by mouth. 01/08/22 04/11/23 Yes [provider]  Prednisol Ace-Moxiflox-Bromfen 1-0.5-0.075 % SUSP Apply to eye. 07/13/21  Yes [provider]  rOPINIRole (REQUIP) 1 MG tablet Take 1 mg by  mouth 3 (three) times daily.   Yes [provider]  rOPINIRole (REQUIP) 1 MG tablet Take 1 tablet by mouth 3 (three) times daily. 02/02/22  Yes [provider]  simvastatin (ZOCOR) 40 MG tablet Take 40 mg by mouth daily.   Yes [provider]  simvastatin (ZOCOR) 40 MG tablet Take by mouth.   Yes [provider]  sodium fluoride (FLUORISHIELD) 1.1 % GEL dental gel Place onto teeth. 03/28/20  Yes [provider]  timolol (TIMOPTIC) 0.5 % ophthalmic  solution Place 1 drop into both eyes 2 (two) times daily. 08/15/21  Yes [provider]  UNKNOWN TO PATIENT Take by mouth.   Yes [provider]    Family History History reviewed. No pertinent family history.  Social History Social History   Tobacco Use   Smoking status: Never   Smokeless tobacco: Never  Substance Use Topics   Alcohol use: No   Drug use: No     Allergies   Patient has no known allergies.   Review of Systems Review of Systems  Constitutional:  Negative for fever.  Musculoskeletal:  Positive for arthralgias and joint swelling.  Skin:  Negative for color change.  Neurological:  Negative for weakness and numbness.     Physical Exam Triage Vital Signs ED Triage Vitals  Enc Vitals Group     BP 04/11/23 1120 127/85     Pulse Rate 04/11/23 1120 94     Resp 04/11/23 1120 16     Temp 04/11/23 1120 98.2 F (36.8 C)     Temp Source 04/11/23 1120 Oral     SpO2 04/11/23 1120 96 %     Weight 04/11/23 1120 160 lb (72.6 kg)     Height 04/11/23 1120 5\' 5"  (1.651 m)     Head Circumference --      Peak Flow --      Pain Score 04/11/23 1124 9     Pain Loc --      Pain Edu? --      Excl. in GC? --    No data found.  Updated Vital Signs BP 127/85 (BP Location: Left Arm)   Pulse 94   Temp 98.2 F (36.8 C) (Oral)   Resp 16   Ht 5\' 5"  (1.651 m)   Wt 160 lb (72.6 kg)   SpO2 96%   BMI 26.63 kg/m   Visual Acuity Right Eye Distance:   Left Eye Distance:   Bilateral Distance:    Right Eye Near:   Left Eye Near:    Bilateral Near:     Physical Exam Vitals and nursing note reviewed.  Constitutional:      Appearance: Normal appearance. She is not ill-appearing.  HENT:     Head: Normocephalic and atraumatic.  Musculoskeletal:        General: Swelling, tenderness, deformity and signs of injury present.  Skin:    General: Skin is warm and dry.     Capillary Refill: Capillary refill takes less than 2 seconds.     Findings: No bruising  or erythema.  Neurological:     General: No focal deficit present.     Mental Status: She is alert and oriented to person, place, and time.      UC Treatments / Results  Labs (all labs ordered are listed, but only abnormal results are displayed) Labs Reviewed - No data to display  EKG   Radiology DG Wrist Complete Left  Result Date: 04/11/2023 CLINICAL DATA:  Pain and swelling after a ground level fall. EXAM: LEFT WRIST - COMPLETE 3+ VIEW COMPARISON:  None Available. FINDINGS: Cortical irregularity of the distal radius consistent with a mildly displaced fracture. 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 Generalized soft tissue swelling about the wrist. IMPRESSION: Mildly displaced fracture of the distal radius. Electronically Signed   By: Larose Hires D.O.   On: 04/11/2023 12:08    Procedures Procedures (including critical care time)  Medications Ordered in UC Medications - No data to display  Initial Impression / Assessment and Plan / UC Course  I have reviewed the triage vital signs and the nursing notes.  Pertinent labs & imaging results that were available during my care of the patient were reviewed by me and considered in my medical decision making (see chart for details).   Patient is a pleasant, nontoxic-appearing 77 year old female presenting for evaluation of pain and swelling in her left hand and wrist after suffering a ground-level fall as outlined HPI above.  Patient seen image above there is a deformity to the base of the first metacarpal with some mild overlying edema but no ecchymosis.  Patient has full range of motion and sensation in her fingers and her grip strength in her left hand is 5/5.  She reports that she fell back and she fell with her wrist flexed catching her self with her left hand.  She did not strike her head or have a loss of consciousness.  She is not on any blood thinners.  No numbness or tingling in her fingers.  She does have limited  range of motion secondary to pain.  There is mild tenderness with palpating the area of deformity as well as with compression of the radial and ulnar styloids and palpation of the carpal bones.  There is no tenderness at all with palpation any of the metacarpals of her left hand.  I will obtain a radiograph of the left wrist to rule out bony abnormality.  Final Clinical Impressions(s) / UC Diagnoses   Final diagnoses:  Closed fracture of distal end of left radius, unspecified fracture morphology, initial encounter     Discharge Instructions      Your x-rays today show that you have a fracture of your distal radius as a result of your fall this morning.  We have placed you in a splint to help protect your wrist from further injury and to keep the bones immobile so it decreases your pain.  Wear the splint at all times.  Use the sling that we have provided to help to support the weight of the splint during the day.  Do not wear it to bed at night.  You may apply ice on top of the splint for torments at a time 2-3 times a day to help with pain and swelling.  Keep your left arm elevated is much as possible to help decrease swelling and aid in pain relief as well.  You may also take over-the-counter Tylenol and/or ibuprofen according to the package instructions as needed for pain and inflammation.  I given the contact information for Dr. Signa Kell at [facility].  Please call his office and schedule an appointment for next week to be reevaluated for the fracture and determine if you need to have a cast placed.  If you develop any increased pain, swelling, or numbness and tingling in her fingers either return for reevaluation or seek care in the ER.     ED Prescriptions  None    PDMP not reviewed this encounter.   Becky Augusta, NP 04/11/23 1222

## 2023-04-15 ENCOUNTER — Ambulatory Visit: Payer: Medicare HMO | Admitting: Physical Therapy

## 2023-04-18 ENCOUNTER — Encounter: Payer: Medicare HMO | Admitting: Physical Therapy

## 2023-08-26 IMAGING — CR DG FINGER LITTLE 2+V*L*
3 series · 3 of 3 positions shown · non-contrast
Comparison: None

CLINICAL DATA: Trauma to tip of LEFT little finger with electric
trimming shears

EXAM:
LEFT LITTLE FINGER 2+V

[finger ap]
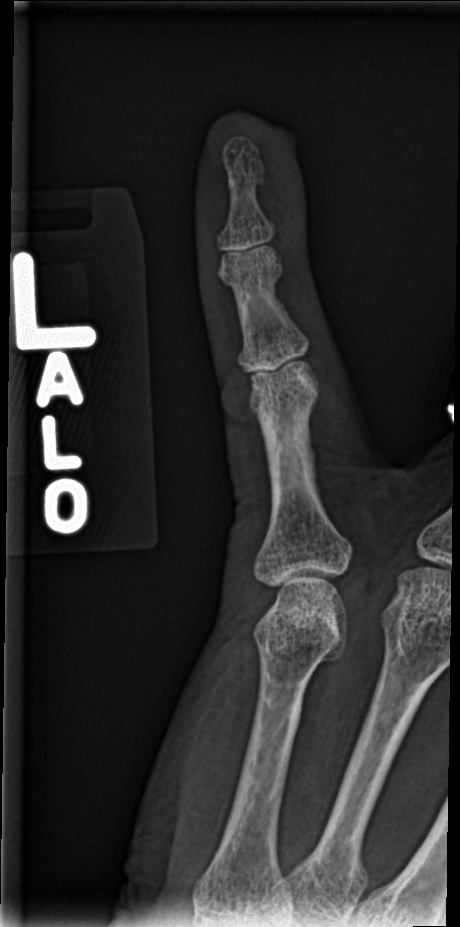

[finger obl]
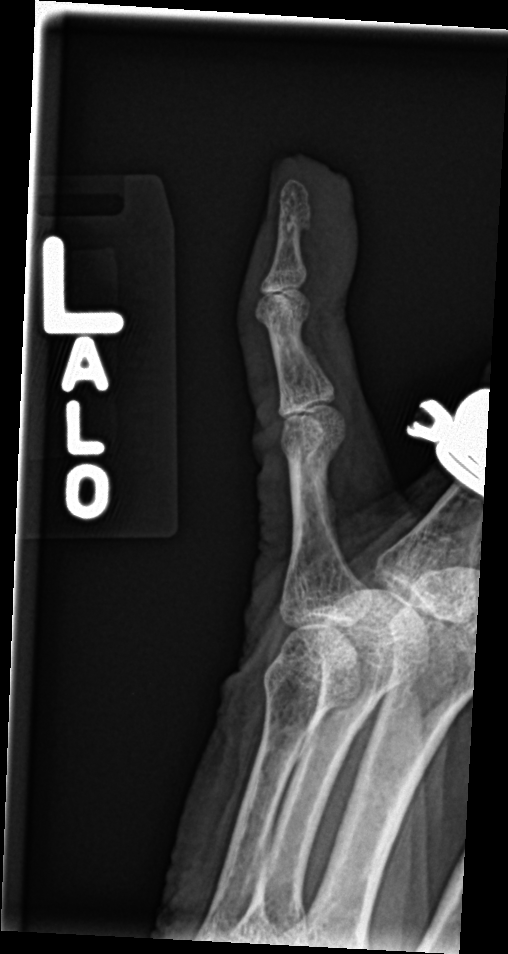

[finger lat]
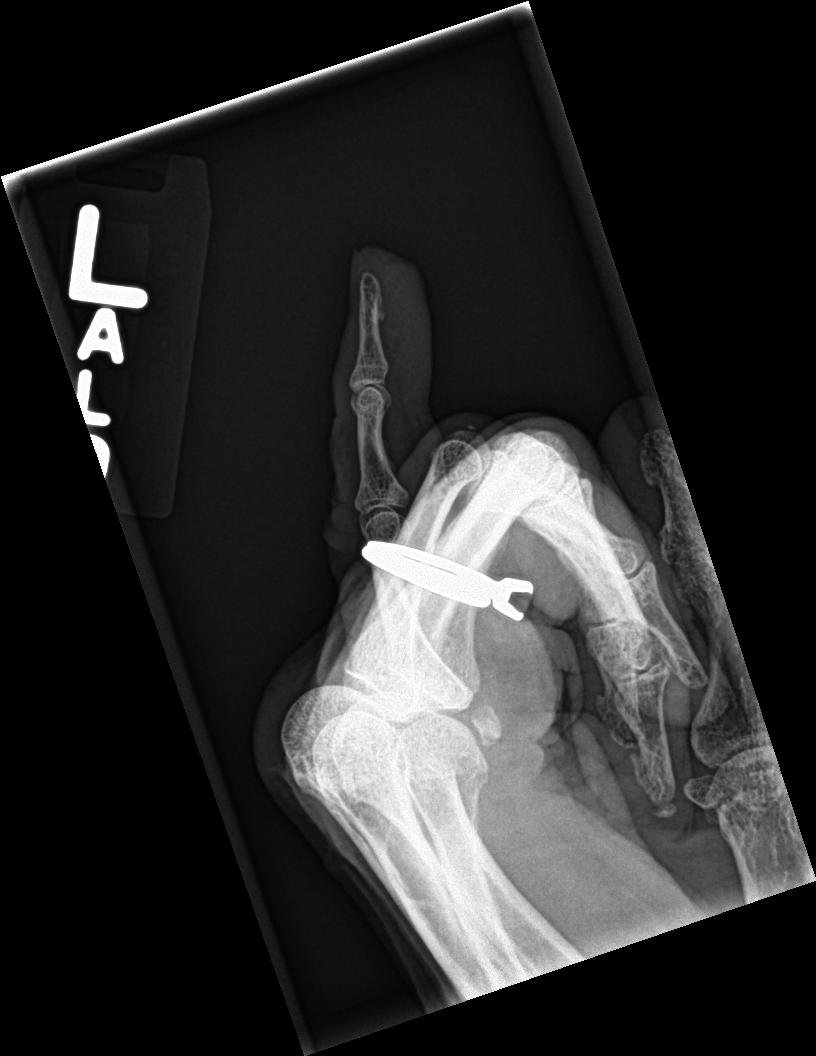

[3 of 3 positions shown; findings below may reference images not displayed]

FINDINGS: Soft tissue irregularity at tip of distal phalanx.

Osseous demineralization.

Mild narrowing of IP joints.

No acute fracture, dislocation, or bone destruction.

No radiopaque foreign bodies.
IMPRESSION: No acute osseous abnormalities.

## 2024-11-26 DEATH — deceased
